# Patient Record
Sex: Male | Born: 1950 | Race: White | Hispanic: No | Marital: Single | Smoking: Never smoker
Health system: Southern US, Community
[De-identification: ages and names within clinical notes are randomized; demographics above are authoritative.]

## PROBLEM LIST (undated history)

## (undated) DIAGNOSIS — K5900 Constipation, unspecified: Secondary | ICD-10-CM

## (undated) DIAGNOSIS — I499 Cardiac arrhythmia, unspecified: Secondary | ICD-10-CM

## (undated) DIAGNOSIS — H9190 Unspecified hearing loss, unspecified ear: Secondary | ICD-10-CM

## (undated) DIAGNOSIS — I890 Lymphedema, not elsewhere classified: Secondary | ICD-10-CM

## (undated) DIAGNOSIS — L989 Disorder of the skin and subcutaneous tissue, unspecified: Secondary | ICD-10-CM

## (undated) DIAGNOSIS — I071 Rheumatic tricuspid insufficiency: Secondary | ICD-10-CM

## (undated) DIAGNOSIS — F32A Depression, unspecified: Secondary | ICD-10-CM

## (undated) DIAGNOSIS — F329 Major depressive disorder, single episode, unspecified: Secondary | ICD-10-CM

## (undated) DIAGNOSIS — R0602 Shortness of breath: Secondary | ICD-10-CM

## (undated) DIAGNOSIS — I10 Essential (primary) hypertension: Secondary | ICD-10-CM

## (undated) DIAGNOSIS — I340 Nonrheumatic mitral (valve) insufficiency: Secondary | ICD-10-CM

## (undated) DIAGNOSIS — M1990 Unspecified osteoarthritis, unspecified site: Secondary | ICD-10-CM

## (undated) DIAGNOSIS — Z9289 Personal history of other medical treatment: Secondary | ICD-10-CM

## (undated) HISTORY — PX: ANKLE SURGERY: SHX546

## (undated) HISTORY — PX: BACK SURGERY: SHX140

---

## 2004-04-06 ENCOUNTER — Other Ambulatory Visit: Payer: Self-pay

## 2006-01-04 ENCOUNTER — Ambulatory Visit: Payer: Self-pay | Admitting: Family Medicine

## 2006-06-22 ENCOUNTER — Inpatient Hospital Stay: Payer: Self-pay | Admitting: Surgery

## 2007-12-14 ENCOUNTER — Ambulatory Visit: Payer: Self-pay | Admitting: Cardiology

## 2008-11-08 DIAGNOSIS — Z9289 Personal history of other medical treatment: Secondary | ICD-10-CM

## 2008-11-08 HISTORY — DX: Personal history of other medical treatment: Z92.89

## 2008-11-08 HISTORY — PX: CHOLECYSTECTOMY: SHX55

## 2008-12-09 ENCOUNTER — Inpatient Hospital Stay: Payer: Self-pay | Admitting: Internal Medicine

## 2009-01-20 ENCOUNTER — Inpatient Hospital Stay: Payer: Self-pay | Admitting: Cardiology

## 2009-02-10 ENCOUNTER — Emergency Department: Payer: Self-pay

## 2009-05-26 ENCOUNTER — Emergency Department: Payer: Self-pay | Admitting: Internal Medicine

## 2009-06-08 ENCOUNTER — Ambulatory Visit: Payer: Self-pay | Admitting: Internal Medicine

## 2009-06-09 ENCOUNTER — Emergency Department: Payer: Self-pay | Admitting: Emergency Medicine

## 2009-06-12 ENCOUNTER — Emergency Department: Payer: Self-pay | Admitting: Unknown Physician Specialty

## 2009-06-16 ENCOUNTER — Inpatient Hospital Stay: Payer: Self-pay | Admitting: Student

## 2009-07-09 ENCOUNTER — Ambulatory Visit: Payer: Self-pay | Admitting: Internal Medicine

## 2010-02-27 ENCOUNTER — Inpatient Hospital Stay: Payer: Self-pay | Admitting: Internal Medicine

## 2010-03-11 ENCOUNTER — Ambulatory Visit: Payer: Self-pay | Admitting: Internal Medicine

## 2010-06-22 ENCOUNTER — Ambulatory Visit: Payer: Self-pay

## 2010-06-23 ENCOUNTER — Inpatient Hospital Stay: Payer: Self-pay | Admitting: Internal Medicine

## 2010-07-02 ENCOUNTER — Ambulatory Visit: Payer: Self-pay

## 2010-07-14 ENCOUNTER — Ambulatory Visit: Payer: Self-pay

## 2010-07-20 ENCOUNTER — Ambulatory Visit: Payer: Self-pay

## 2010-07-22 ENCOUNTER — Ambulatory Visit: Payer: Self-pay

## 2010-07-23 ENCOUNTER — Ambulatory Visit: Payer: Self-pay

## 2010-07-24 ENCOUNTER — Ambulatory Visit: Payer: Self-pay

## 2010-07-27 ENCOUNTER — Ambulatory Visit: Payer: Self-pay | Admitting: Internal Medicine

## 2010-07-29 ENCOUNTER — Ambulatory Visit: Payer: Self-pay

## 2010-08-03 ENCOUNTER — Ambulatory Visit: Payer: Self-pay

## 2010-08-06 ENCOUNTER — Ambulatory Visit: Payer: Self-pay

## 2010-08-10 ENCOUNTER — Ambulatory Visit: Payer: Self-pay

## 2010-08-13 ENCOUNTER — Ambulatory Visit: Payer: Self-pay

## 2010-08-14 ENCOUNTER — Ambulatory Visit: Payer: Self-pay

## 2010-08-17 ENCOUNTER — Ambulatory Visit: Payer: Self-pay

## 2010-08-20 ENCOUNTER — Ambulatory Visit: Payer: Self-pay

## 2010-08-24 ENCOUNTER — Ambulatory Visit: Payer: Self-pay

## 2010-08-27 ENCOUNTER — Ambulatory Visit: Payer: Self-pay

## 2010-08-31 ENCOUNTER — Ambulatory Visit: Payer: Self-pay

## 2010-09-02 ENCOUNTER — Encounter: Payer: Self-pay | Admitting: Internal Medicine

## 2010-09-08 ENCOUNTER — Encounter: Payer: Self-pay | Admitting: Internal Medicine

## 2010-09-16 ENCOUNTER — Ambulatory Visit: Payer: Self-pay | Admitting: Internal Medicine

## 2010-10-08 ENCOUNTER — Encounter: Payer: Self-pay | Admitting: Internal Medicine

## 2010-11-08 ENCOUNTER — Encounter: Payer: Self-pay | Admitting: Internal Medicine

## 2010-12-09 ENCOUNTER — Encounter: Payer: Self-pay | Admitting: Internal Medicine

## 2011-01-07 ENCOUNTER — Encounter: Payer: Self-pay | Admitting: Internal Medicine

## 2011-02-01 ENCOUNTER — Encounter: Payer: Self-pay | Admitting: Cardiothoracic Surgery

## 2011-02-07 ENCOUNTER — Encounter: Payer: Self-pay | Admitting: Internal Medicine

## 2011-02-07 ENCOUNTER — Encounter: Payer: Self-pay | Admitting: Cardiothoracic Surgery

## 2011-03-09 ENCOUNTER — Encounter: Payer: Self-pay | Admitting: Cardiothoracic Surgery

## 2011-04-09 ENCOUNTER — Encounter: Payer: Self-pay | Admitting: Cardiothoracic Surgery

## 2011-05-09 ENCOUNTER — Encounter: Payer: Self-pay | Admitting: Cardiothoracic Surgery

## 2011-07-30 ENCOUNTER — Encounter: Payer: Self-pay | Admitting: Cardiothoracic Surgery

## 2011-08-09 ENCOUNTER — Encounter: Payer: Self-pay | Admitting: Cardiothoracic Surgery

## 2012-01-22 ENCOUNTER — Emergency Department: Payer: Self-pay | Admitting: Emergency Medicine

## 2012-01-22 LAB — COMPREHENSIVE METABOLIC PANEL
Albumin: 3.7 g/dL (ref 3.4–5.0)
Anion Gap: 12 (ref 7–16)
BUN: 17 mg/dL (ref 7–18)
Bilirubin,Total: 1 mg/dL (ref 0.2–1.0)
Chloride: 105 mmol/L (ref 98–107)
Co2: 25 mmol/L (ref 21–32)
EGFR (African American): >60
EGFR (Non-African Amer.): >60
Osmolality: 285 (ref 275–301)
Potassium: 3.4 mmol/L — ABNORMAL LOW (ref 3.5–5.1)
SGPT (ALT): 31 U/L
Sodium: 142 mmol/L (ref 136–145)
Total Protein: 7.8 g/dL (ref 6.4–8.2)

## 2012-01-22 LAB — CBC
HGB: 14.3 g/dL (ref 13.0–18.0)
MCH: 29.1 pg (ref 26.0–34.0)
MCHC: 33.4 g/dL (ref 32.0–36.0)
RBC: 4.92 10*6/uL (ref 4.40–5.90)
RDW: 13.9 % (ref 11.5–14.5)

## 2012-02-13 ENCOUNTER — Emergency Department: Payer: Self-pay | Admitting: Internal Medicine

## 2012-02-13 LAB — URINALYSIS, COMPLETE
Bilirubin,UR: NEGATIVE
Glucose,UR: NEGATIVE mg/dL (ref 0–75)
Ketone: NEGATIVE
Leukocyte Esterase: NEGATIVE
Nitrite: NEGATIVE
Ph: 7 (ref 4.5–8.0)
Protein: 30
RBC,UR: 1 /HPF (ref 0–5)
Squamous Epithelial: <1
WBC UR: 1 /HPF (ref 0–5)

## 2012-02-13 LAB — COMPREHENSIVE METABOLIC PANEL
Alkaline Phosphatase: 106 U/L (ref 50–136)
Anion Gap: 7 (ref 7–16)
BUN: 15 mg/dL (ref 7–18)
Chloride: 108 mmol/L — ABNORMAL HIGH (ref 98–107)
Co2: 27 mmol/L (ref 21–32)
EGFR (African American): >60
EGFR (Non-African Amer.): >60
Glucose: 81 mg/dL (ref 65–99)
Osmolality: 283 (ref 275–301)
Potassium: 4.2 mmol/L (ref 3.5–5.1)
SGPT (ALT): 33 U/L
Total Protein: 7.2 g/dL (ref 6.4–8.2)

## 2012-02-13 LAB — CBC
HCT: 34.5 % — ABNORMAL LOW (ref 40.0–52.0)
MCHC: 34.7 g/dL (ref 32.0–36.0)
Platelet: 64 10*3/uL — ABNORMAL LOW (ref 150–440)
RBC: 3.91 10*6/uL — ABNORMAL LOW (ref 4.40–5.90)
RDW: 15.1 % — ABNORMAL HIGH (ref 11.5–14.5)
WBC: 3.6 10*3/uL — ABNORMAL LOW (ref 3.8–10.6)

## 2012-02-13 LAB — PROTIME-INR: INR: 4.1

## 2012-03-23 ENCOUNTER — Ambulatory Visit: Payer: Self-pay | Admitting: Specialist

## 2012-03-29 ENCOUNTER — Ambulatory Visit: Payer: Self-pay | Admitting: Specialist

## 2012-06-01 ENCOUNTER — Other Ambulatory Visit (HOSPITAL_COMMUNITY): Payer: Self-pay | Admitting: Neurological Surgery

## 2012-06-01 DIAGNOSIS — M5412 Radiculopathy, cervical region: Secondary | ICD-10-CM

## 2012-06-01 DIAGNOSIS — M542 Cervicalgia: Secondary | ICD-10-CM

## 2012-06-02 ENCOUNTER — Encounter (HOSPITAL_COMMUNITY): Payer: Self-pay

## 2012-06-02 ENCOUNTER — Other Ambulatory Visit (HOSPITAL_COMMUNITY): Payer: Self-pay | Admitting: *Deleted

## 2012-06-02 ENCOUNTER — Ambulatory Visit (HOSPITAL_COMMUNITY)
Admission: RE | Admit: 2012-06-02 | Discharge: 2012-06-02 | Disposition: A | Discharge status: Home / Self Care | Payer: PRIVATE HEALTH INSURANCE | Source: Ambulatory Visit | Attending: Neurological Surgery | Admitting: Neurological Surgery

## 2012-06-02 ENCOUNTER — Encounter (HOSPITAL_COMMUNITY)
Admission: RE | Admit: 2012-06-02 | Discharge: 2012-06-02 | Disposition: A | Discharge status: Home / Self Care | Payer: PRIVATE HEALTH INSURANCE | Source: Ambulatory Visit | Attending: Neurological Surgery | Admitting: Neurological Surgery

## 2012-06-02 DIAGNOSIS — Z01818 Encounter for other preprocedural examination: Secondary | ICD-10-CM | POA: Insufficient documentation

## 2012-06-02 DIAGNOSIS — Z01812 Encounter for preprocedural laboratory examination: Secondary | ICD-10-CM | POA: Insufficient documentation

## 2012-06-02 DIAGNOSIS — I4891 Unspecified atrial fibrillation: Secondary | ICD-10-CM | POA: Insufficient documentation

## 2012-06-02 DIAGNOSIS — I10 Essential (primary) hypertension: Secondary | ICD-10-CM | POA: Insufficient documentation

## 2012-06-02 HISTORY — DX: Essential (primary) hypertension: I10

## 2012-06-02 HISTORY — DX: Shortness of breath: R06.02

## 2012-06-02 HISTORY — DX: Rheumatic tricuspid insufficiency: I07.1

## 2012-06-02 HISTORY — DX: Unspecified hearing loss, unspecified ear: H91.90

## 2012-06-02 HISTORY — DX: Personal history of other medical treatment: Z92.89

## 2012-06-02 HISTORY — DX: Cardiac arrhythmia, unspecified: I49.9

## 2012-06-02 HISTORY — DX: Lymphedema, not elsewhere classified: I89.0

## 2012-06-02 HISTORY — DX: Disorder of the skin and subcutaneous tissue, unspecified: L98.9

## 2012-06-02 HISTORY — DX: Depression, unspecified: F32.A

## 2012-06-02 HISTORY — DX: Major depressive disorder, single episode, unspecified: F32.9

## 2012-06-02 HISTORY — DX: Nonrheumatic mitral (valve) insufficiency: I34.0

## 2012-06-02 HISTORY — DX: Unspecified osteoarthritis, unspecified site: M19.90

## 2012-06-02 HISTORY — DX: Constipation, unspecified: K59.00

## 2012-06-02 LAB — PROTIME-INR
INR: 1.36 (ref 0.00–1.49)
Prothrombin Time: 17 seconds — ABNORMAL HIGH (ref 11.6–15.2)

## 2012-06-02 LAB — BASIC METABOLIC PANEL
CO2: 22 mEq/L (ref 19–32)
Calcium: 9 mg/dL (ref 8.4–10.5)
GFR calc Af Amer: >90 mL/min (ref >90)
GFR calc non Af Amer: >90 mL/min (ref >90)
Sodium: 138 mEq/L (ref 135–145)

## 2012-06-02 LAB — CBC
MCV: 82.6 fL (ref 78.0–100.0)
Platelets: 69 10*3/uL — ABNORMAL LOW (ref 150–400)
RBC: 5.18 MIL/uL (ref 4.22–5.81)
WBC: 4.3 10*3/uL (ref 4.0–10.5)

## 2012-06-02 LAB — HEPATIC FUNCTION PANEL
Indirect Bilirubin: 0.9 mg/dL (ref 0.3–0.9)
Total Protein: 7.6 g/dL (ref 6.0–8.3)

## 2012-06-02 LAB — APTT: aPTT: 41 seconds — ABNORMAL HIGH (ref 24–37)

## 2012-06-02 NOTE — Progress Notes (Signed)
I faxed request to Dr.Parashos and Dr  Terance Hart requesting office notes, labs, X-Rays, EGs, Stress Test and 2 DEchos.

## 2012-06-02 NOTE — Progress Notes (Signed)
I called Dr Yetta Barre' office and asked for orders to be entered.

## 2012-06-02 NOTE — Progress Notes (Signed)
I spoke with Aram Beecham at Dr Yetta Barre' office, she said orders were faxed.  Aram Beecham also said that patient does not need to stop Coumadin.

## 2012-06-02 NOTE — Progress Notes (Signed)
I received notes information from Dr Celene Kras' office, no labs sent, I called the office it is closed for the day.

## 2012-06-02 NOTE — Progress Notes (Signed)
I call Dr Gar Gibbon and asked for recent labs.  I was informed that Dr Darrold Junker is manging pt's Coumadin.  Labs will be faxed.

## 2012-06-02 NOTE — Pre-Procedure Instructions (Signed)
20 Nathaniel Simpson  06/02/2012   Your procedure is scheduled on:  Tuesday, July 30th  Report to [facility] at 9:00 AM.  Call this number if you have problems the morning of surgery: [phone]   Remember:   Do not eat food Or anything to drink:After Midnight.      Take these medicines the morning of surgery with A SIP OF WATER: Allerga, Diltiazem (Dilacor)   Do not wear jewelry, make-up or nail polish.  Do not wear lotions, powders, or perfumes. You may wear deodorant.  Do not shave 48 hours prior to surgery. Men may shave face and neck.  Do not bring valuables to the hospital.  Contacts, dentures or bridgework may not be worn into surgery.  Leave suitcase in the car. After surgery it may be brought to your room.  For patients admitted to the hospital, checkout time is 11:00 AM the day of discharge.   Patients discharged the day of surgery will not be allowed to drive home.  Name and phone number of your driver  Special Instructions: CHG Shower Use Special Wash: 1/2 bottle night before surgery and 1/2 bottle morning of surgery.   Please read over the following fact sheets that you were given: Pain Booklet and Surgical Site Infection Prevention

## 2012-06-05 ENCOUNTER — Encounter (HOSPITAL_COMMUNITY): Payer: Self-pay | Admitting: Vascular Surgery

## 2012-06-05 NOTE — Consult Note (Signed)
Anesthesia Chart Review:  Patient is a 61 year old male scheduled for MRI under anesthesia on 06/06/12.  MRI is apparently ordered by Dr. Yetta Barre.  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  His PCP is Dr. Terance Hart.  His last OV note from April 2013 listed DM2 as a diagnosis as well, although he is not requiring medication and his A1C then was only 4.5.  His weight is 355 lb, height 6\' 2" , BMI 45.6.  His Cardiologist is Dr. Darrold Junker at [facility]).  He was last seen on 05/24/12.  He recommended a repeat echo to follow-up MR/TR as he reported some worsening of DOE.   His echo is not scheduled until 06/15/12.  EKG ([facility]) on 01/22/12 showed afib, minimal voltage criteria for LVH.  I called [facility] and reviewed planned procedure with staff.  Sandy, Dr. Darrold Junker' nurse reviewed this with him, and he stated the echo did not have to be done prior to Mr. Bommarito having an MRI under [facility].  Currently, his last echo from 07/08/10 showed mild global LV dysfunction, EF 45-50%, mild LV enlargement, moderate RV enlargement, moderate biatrial enlargement, moderate MR/TR.  CXR on 06/02/12 showed: Upper normal heart size. No focal worrisome or acute  cardiopulmonary abnormality identified.  Labs noted.  PLT are 69.  AST is 38, ALT 21.  PT/INR 17/1.36.  PTT 41.  Glucose is 94.  His PLT were 76 in April 2013.   He is not stopping his Coumadin for his MRI, so I am not planning to repeat his PT/PTT on arrival.  There is no mention to the etiology of this thrombocytopenia in his Cardiologist's or PCP note.  The patient said he remembers being told his PLTs are low, but has never really been told an explanation.  I told him that if he requires surgery, he would likely need further evaluation.  I also called and spoke with Rodney Booze in MRI [phone]) re: MRI weight limits.  She said typically the cut off is 350 lb, but it can  depend of the patient's body habitus.  If the ordering physician did not want to reschedule for an open MRI then she would recommend bringing the patient early and having him go down to MRI on arrival, so it can be determined if he could fit before getting the Anesthesiologist involved.  I spoke with Erie Noe at Dr. Yetta Barre' office re: MRI recommendations, thrombocytopenia and Cardiology history in the invent that he may ultimately require surgery and would probably need at least medical clearance (Hematology evaluation) and possibly Cardiology clearance depending on echo results.  She reviewed with Dr. Yetta Barre.  Apparently, open MRI has been attempted in the past, but he couldn't lay down due to pain.  The MRI will be on his c-spine.    I have communicated with his Cardiologist, the patient, and Anesthesiologist Dr. Jacklynn Bue.  He will need to go down to MRI on arrival to see if his body habitus will allow for closed MRI.  If not, then Anesthesia will need to be notified.  Currently, Mr. Micciche is scheduled to arrive 2 hours before his MRI.  Shonna Chock, PA-C

## 2012-06-06 ENCOUNTER — Encounter (HOSPITAL_COMMUNITY): Payer: Self-pay | Admitting: Certified Registered Nurse Anesthetist

## 2012-06-06 ENCOUNTER — Ambulatory Visit (HOSPITAL_COMMUNITY)
Admission: RE | Admit: 2012-06-06 | Discharge: 2012-06-06 | Disposition: A | Discharge status: Home / Self Care | Payer: PRIVATE HEALTH INSURANCE | Source: Ambulatory Visit | Attending: Neurological Surgery | Admitting: Neurological Surgery

## 2012-06-06 ENCOUNTER — Encounter (HOSPITAL_COMMUNITY): Payer: Self-pay | Admitting: Vascular Surgery

## 2012-06-06 ENCOUNTER — Other Ambulatory Visit (HOSPITAL_COMMUNITY): Payer: Self-pay

## 2012-06-06 ENCOUNTER — Encounter (HOSPITAL_COMMUNITY): Payer: Self-pay | Admitting: *Deleted

## 2012-06-06 ENCOUNTER — Encounter (HOSPITAL_COMMUNITY)
Admission: RE | Disposition: A | Discharge status: Home / Self Care | Payer: Self-pay | Source: Ambulatory Visit | Attending: Neurological Surgery

## 2012-06-06 ENCOUNTER — Ambulatory Visit (HOSPITAL_COMMUNITY): Payer: PRIVATE HEALTH INSURANCE | Admitting: Vascular Surgery

## 2012-06-06 DIAGNOSIS — M5412 Radiculopathy, cervical region: Secondary | ICD-10-CM

## 2012-06-06 DIAGNOSIS — M542 Cervicalgia: Secondary | ICD-10-CM

## 2012-06-06 DIAGNOSIS — M4802 Spinal stenosis, cervical region: Secondary | ICD-10-CM | POA: Insufficient documentation

## 2012-06-06 LAB — GLUCOSE, CAPILLARY: Glucose-Capillary: 91 mg/dL (ref 70–99)

## 2012-06-06 SURGERY — RADIOLOGY WITH ANESTHESIA
Anesthesia: General

## 2012-06-06 MED ORDER — PROPOFOL 10 MG/ML IV EMUL
INTRAVENOUS | Status: DC | PRN
  Administered 2012-06-06: 200 mg via INTRAVENOUS

## 2012-06-06 MED ORDER — LIDOCAINE HCL (CARDIAC) 20 MG/ML IV SOLN
INTRAVENOUS | Status: DC | PRN
  Administered 2012-06-06: 70 mg via INTRAVENOUS

## 2012-06-06 MED ORDER — SUCCINYLCHOLINE CHLORIDE 20 MG/ML IJ SOLN
INTRAMUSCULAR | Status: DC | PRN
  Administered 2012-06-06: 110 mg via INTRAVENOUS

## 2012-06-06 MED ORDER — HYDROMORPHONE HCL PF 1 MG/ML IJ SOLN: 0.2500 mg | INTRAMUSCULAR | Status: DC | PRN

## 2012-06-06 MED ORDER — FENTANYL CITRATE 0.05 MG/ML IJ SOLN
INTRAMUSCULAR | Status: DC | PRN
  Administered 2012-06-06: 100 ug via INTRAVENOUS

## 2012-06-06 MED ORDER — LACTATED RINGERS IV SOLN
INTRAVENOUS | Status: DC
  Administered 2012-06-06: 11:00:00 via INTRAVENOUS

## 2012-06-06 MED ORDER — LACTATED RINGERS IV SOLN
INTRAVENOUS | Status: DC | PRN
  Administered 2012-06-06: 12:00:00 via INTRAVENOUS

## 2012-06-06 MED ORDER — ONDANSETRON HCL 4 MG/2ML IJ SOLN: 4.0000 mg | Freq: Once | INTRAMUSCULAR | Status: DC | PRN

## 2012-06-06 NOTE — Anesthesia Postprocedure Evaluation (Signed)
  Anesthesia Post-op Note  Patient: Nathaniel Simpson  Procedure(s) Performed: Procedure(s) (LRB): RADIOLOGY WITH ANESTHESIA (N/A)  Patient Location: PACU  Anesthesia Type: General  Level of Consciousness: awake, alert , oriented and patient cooperative  Airway and Oxygen Therapy: Patient Spontanous Breathing and Patient connected to nasal cannula oxygen  Post-op Pain: none  Post-op Assessment: Post-op Vital signs reviewed, Patient's Cardiovascular Status Stable, Respiratory Function Stable, Patent Airway and No signs of Nausea or vomiting  Post-op Vital Signs: stable  Complications: No apparent anesthesia complications

## 2012-06-06 NOTE — Anesthesia Preprocedure Evaluation (Addendum)
Anesthesia Evaluation  Patient identified by MRN, date of birth, ID band Patient awake    Reviewed: Allergy & Precautions, H&P , NPO status , Patient's Chart, lab work & pertinent test results  History of Anesthesia Complications (+) DIFFICULT AIRWAY  Airway Mallampati: I TM Distance: >3 FB Neck ROM: full    Dental  (+) Poor Dentition, Edentulous Upper and Edentulous Lower   Pulmonary shortness of breath,          Cardiovascular hypertension, + dysrhythmias Atrial Fibrillation Rhythm:regular Rate:Normal     Neuro/Psych PSYCHIATRIC DISORDERS Depression    GI/Hepatic   Endo/Other  Morbid obesity  Renal/GU      Musculoskeletal   Abdominal   Peds  Hematology   Anesthesia Other Findings   Reproductive/Obstetrics                        Anesthesia Physical Anesthesia Plan  ASA: III  Anesthesia Plan: General   Post-op Pain Management:    Induction: Intravenous  Airway Management Planned: Oral ETT  Additional Equipment:   Intra-op Plan:   Post-operative Plan: Extubation in OR  Informed Consent: I have reviewed the patients History and Physical, chart, labs and discussed the procedure including the risks, benefits and alternatives for the proposed anesthesia with the patient or authorized representative who has indicated his/her understanding and acceptance.     Plan Discussed with: CRNA, Anesthesiologist and Surgeon  Anesthesia Plan Comments:        Anesthesia Quick Evaluation

## 2012-06-06 NOTE — Transfer of Care (Signed)
Immediate Anesthesia Transfer of Care Note  Patient: Nathaniel Simpson  Procedure(s) Performed: Procedure(s) (LRB): RADIOLOGY WITH ANESTHESIA (N/A)  Patient Location: PACU  Anesthesia Type: General  Level of Consciousness: awake, alert , oriented and patient cooperative  Airway & Oxygen Therapy: Patient Spontanous Breathing and Patient connected to face mask oxygen  Post-op Assessment: Report given to PACU RN, Post -op Vital signs reviewed and stable and Patient moving all extremities  Post vital signs: Reviewed and stable  Complications: No apparent anesthesia complications

## 2012-06-06 NOTE — Progress Notes (Signed)
Pt. Went down to radiology to make sure he fit in the MRI machine. When the tech brought pt. Back they wanted another weight. They stated pt. Would be able to have the procedure done.

## 2012-06-06 NOTE — Preoperative (Signed)
Beta Blockers   Reason not to administer Beta Blockers:Not Applicable 

## 2012-07-17 LAB — CBC WITH DIFFERENTIAL/PLATELET
Basophil #: 0 10*3/uL (ref 0.0–0.1)
Lymphocyte #: 0.4 10*3/uL — ABNORMAL LOW (ref 1.0–3.6)
MCH: 29.7 pg (ref 26.0–34.0)
MCHC: 34.9 g/dL (ref 32.0–36.0)
MCV: 85 fL (ref 80–100)
Monocyte #: 0.4 x10 3/mm (ref 0.2–1.0)
Neutrophil %: 91.4 %
Platelet: 92 10*3/uL — ABNORMAL LOW (ref 150–440)
RBC: 5.02 10*6/uL (ref 4.40–5.90)
RDW: 15.9 % — ABNORMAL HIGH (ref 11.5–14.5)

## 2012-07-17 LAB — BASIC METABOLIC PANEL
Calcium, Total: 8.4 mg/dL — ABNORMAL LOW (ref 8.5–10.1)
Chloride: 108 mmol/L — ABNORMAL HIGH (ref 98–107)
EGFR (Non-African Amer.): >60
Osmolality: 284 (ref 275–301)
Potassium: 4.2 mmol/L (ref 3.5–5.1)
Sodium: 140 mmol/L (ref 136–145)

## 2012-07-18 ENCOUNTER — Inpatient Hospital Stay: Payer: Self-pay | Admitting: Internal Medicine

## 2012-07-19 LAB — CBC WITH DIFFERENTIAL/PLATELET
Basophil #: 0.1 10*3/uL (ref 0.0–0.1)
HCT: 37.6 % — ABNORMAL LOW (ref 40.0–52.0)
Lymphocyte %: 7.9 %
MCV: 86 fL (ref 80–100)
Monocyte %: 6 %
Platelet: 77 10*3/uL — ABNORMAL LOW (ref 150–440)
RBC: 4.4 10*6/uL (ref 4.40–5.90)
RDW: 16.1 % — ABNORMAL HIGH (ref 11.5–14.5)
WBC: 8.3 10*3/uL (ref 3.8–10.6)

## 2012-07-19 LAB — BASIC METABOLIC PANEL
Calcium, Total: 7.8 mg/dL — ABNORMAL LOW (ref 8.5–10.1)
Chloride: 107 mmol/L (ref 98–107)
EGFR (Non-African Amer.): >60
Glucose: 89 mg/dL (ref 65–99)
Osmolality: 280 (ref 275–301)
Potassium: 3.9 mmol/L (ref 3.5–5.1)
Sodium: 140 mmol/L (ref 136–145)

## 2012-07-20 LAB — CLOSTRIDIUM DIFFICILE BY PCR

## 2012-07-21 LAB — PROTIME-INR
INR: 1.5
Prothrombin Time: 18.7 secs — ABNORMAL HIGH (ref 11.5–14.7)

## 2012-07-22 LAB — PROTIME-INR: Prothrombin Time: 21.7 secs — ABNORMAL HIGH (ref 11.5–14.7)

## 2012-07-23 LAB — CREATININE, SERUM
Creatinine: 0.95 mg/dL
EGFR (African American): >60
EGFR (Non-African Amer.): >60

## 2012-07-23 LAB — CULTURE, BLOOD (SINGLE)

## 2012-07-23 LAB — PROTIME-INR: Prothrombin Time: 25.6 secs — ABNORMAL HIGH (ref 11.5–14.7)

## 2012-07-31 ENCOUNTER — Emergency Department: Payer: Self-pay | Admitting: *Deleted

## 2012-07-31 LAB — COMPREHENSIVE METABOLIC PANEL
Albumin: 3.3 g/dL — ABNORMAL LOW (ref 3.4–5.0)
Anion Gap: 10 (ref 7–16)
BUN: 15 mg/dL (ref 7–18)
Bilirubin,Total: 1.1 mg/dL — ABNORMAL HIGH (ref 0.2–1.0)
Creatinine: 1.22 mg/dL (ref 0.60–1.30)
EGFR (African American): >60
Glucose: 86 mg/dL (ref 65–99)
Osmolality: 276 (ref 275–301)
Potassium: 4.6 mmol/L (ref 3.5–5.1)
SGOT(AST): 43 U/L — ABNORMAL HIGH (ref 15–37)
SGPT (ALT): 30 U/L (ref 12–78)
Total Protein: 8.5 g/dL — ABNORMAL HIGH (ref 6.4–8.2)

## 2012-07-31 LAB — CBC WITH DIFFERENTIAL/PLATELET
Basophil %: 0.2 %
Eosinophil #: 0 10*3/uL (ref 0.0–0.7)
Eosinophil %: 0 %
HCT: 45.6 % (ref 40.0–52.0)
HGB: 15.6 g/dL (ref 13.0–18.0)
Lymphocyte #: 0.5 10*3/uL — ABNORMAL LOW (ref 1.0–3.6)
Lymphocyte %: 4.9 %
MCH: 30.1 pg (ref 26.0–34.0)
MCHC: 34.2 g/dL (ref 32.0–36.0)
Monocyte #: 0.4 x10 3/mm (ref 0.2–1.0)
Neutrophil #: 8.9 10*3/uL — ABNORMAL HIGH (ref 1.4–6.5)
Neutrophil %: 91 %
RDW: 16.2 % — ABNORMAL HIGH (ref 11.5–14.5)

## 2012-07-31 LAB — LIPASE, BLOOD: Lipase: 271 U/L (ref 73–393)

## 2012-07-31 LAB — TROPONIN I: Troponin-I: <0.02 ng/mL

## 2012-07-31 LAB — CK TOTAL AND CKMB (NOT AT ARMC)
CK, Total: 55 U/L (ref 35–232)
CK-MB: <0.5 ng/mL — ABNORMAL LOW (ref 0.5–3.6)

## 2012-08-01 LAB — URINALYSIS, COMPLETE
Bilirubin,UR: NEGATIVE
Blood: NEGATIVE
Glucose,UR: NEGATIVE mg/dL (ref 0–75)
Ketone: NEGATIVE
Leukocyte Esterase: NEGATIVE
Nitrite: NEGATIVE
Protein: 100
RBC,UR: 2 /HPF (ref 0–5)
WBC UR: 3 /HPF (ref 0–5)

## 2012-08-01 LAB — PROTIME-INR: INR: 3.3

## 2012-08-01 LAB — APTT: Activated PTT: 60.9 secs — ABNORMAL HIGH (ref 23.6–35.9)

## 2012-08-01 LAB — PRO B NATRIURETIC PEPTIDE: B-Type Natriuretic Peptide: 2198 pg/mL — ABNORMAL HIGH (ref 0–125)

## 2012-08-06 LAB — CULTURE, BLOOD (SINGLE)

## 2012-08-07 ENCOUNTER — Encounter: Payer: Self-pay | Admitting: Cardiothoracic Surgery

## 2012-08-07 ENCOUNTER — Encounter: Payer: Self-pay | Admitting: Nurse Practitioner

## 2012-08-08 ENCOUNTER — Encounter: Payer: Self-pay | Admitting: Nurse Practitioner

## 2012-08-08 ENCOUNTER — Encounter: Payer: Self-pay | Admitting: Cardiothoracic Surgery

## 2012-08-24 ENCOUNTER — Ambulatory Visit: Payer: Self-pay | Admitting: Nurse Practitioner

## 2012-09-08 ENCOUNTER — Encounter: Payer: Self-pay | Admitting: Cardiothoracic Surgery

## 2012-09-08 ENCOUNTER — Encounter: Payer: Self-pay | Admitting: Nurse Practitioner

## 2012-09-26 ENCOUNTER — Other Ambulatory Visit: Payer: Self-pay | Admitting: Gastroenterology

## 2012-09-27 LAB — WOUND CULTURE

## 2012-10-08 ENCOUNTER — Encounter: Payer: Self-pay | Admitting: Nurse Practitioner

## 2012-10-08 ENCOUNTER — Encounter: Payer: Self-pay | Admitting: Cardiothoracic Surgery

## 2012-10-23 ENCOUNTER — Ambulatory Visit: Payer: Self-pay | Admitting: Family Medicine

## 2012-10-26 ENCOUNTER — Ambulatory Visit: Payer: Self-pay | Admitting: Gastroenterology

## 2012-11-08 ENCOUNTER — Encounter: Payer: Self-pay | Admitting: Nurse Practitioner

## 2012-11-08 ENCOUNTER — Encounter: Payer: Self-pay | Admitting: Cardiothoracic Surgery

## 2012-11-28 ENCOUNTER — Other Ambulatory Visit (HOSPITAL_COMMUNITY): Payer: Self-pay | Admitting: Gastroenterology

## 2012-11-28 DIAGNOSIS — K7460 Unspecified cirrhosis of liver: Secondary | ICD-10-CM

## 2012-11-30 ENCOUNTER — Other Ambulatory Visit (HOSPITAL_COMMUNITY): Payer: Self-pay | Admitting: Gastroenterology

## 2012-11-30 DIAGNOSIS — K7460 Unspecified cirrhosis of liver: Secondary | ICD-10-CM

## 2012-12-05 ENCOUNTER — Other Ambulatory Visit: Payer: Self-pay | Admitting: Radiology

## 2012-12-08 ENCOUNTER — Ambulatory Visit (HOSPITAL_COMMUNITY): Admission: RE | Admit: 2012-12-08 | Payer: PRIVATE HEALTH INSURANCE | Source: Ambulatory Visit

## 2013-07-20 ENCOUNTER — Other Ambulatory Visit: Payer: Self-pay | Admitting: Family

## 2013-07-20 LAB — APTT: Activated PTT: 36 secs — ABNORMAL HIGH (ref 23.6–35.9)

## 2013-09-27 ENCOUNTER — Ambulatory Visit: Payer: Self-pay | Admitting: Cardiology

## 2014-02-02 LAB — COMPREHENSIVE METABOLIC PANEL
ALK PHOS: 214 U/L — AB
ANION GAP: 6 — AB (ref 7–16)
Albumin: 3.4 g/dL (ref 3.4–5.0)
BILIRUBIN TOTAL: 2.5 mg/dL — AB (ref 0.2–1.0)
BUN: 17 mg/dL (ref 7–18)
CO2: 27 mmol/L (ref 21–32)
Calcium, Total: 8.6 mg/dL (ref 8.5–10.1)
Chloride: 106 mmol/L (ref 98–107)
Creatinine: 1.23 mg/dL (ref 0.60–1.30)
EGFR (African American): >60
GLUCOSE: 94 mg/dL (ref 65–99)
Osmolality: 279 (ref 275–301)
Potassium: 3.5 mmol/L (ref 3.5–5.1)
SGOT(AST): 49 U/L — ABNORMAL HIGH (ref 15–37)
SGPT (ALT): 22 U/L (ref 12–78)
SODIUM: 139 mmol/L (ref 136–145)
TOTAL PROTEIN: 8 g/dL (ref 6.4–8.2)

## 2014-02-02 LAB — CBC
HCT: 36.1 % — ABNORMAL LOW (ref 40.0–52.0)
HGB: 12.5 g/dL — ABNORMAL LOW (ref 13.0–18.0)
MCH: 31.3 pg (ref 26.0–34.0)
MCHC: 34.5 g/dL (ref 32.0–36.0)
MCV: 91 fL (ref 80–100)
PLATELETS: 79 10*3/uL — AB (ref 150–440)
RBC: 3.99 10*6/uL — AB (ref 4.40–5.90)
RDW: 16.5 % — ABNORMAL HIGH (ref 11.5–14.5)
WBC: 5.8 10*3/uL (ref 3.8–10.6)

## 2014-02-02 LAB — PROTIME-INR
INR: 1.3
Prothrombin Time: 15.8 secs — ABNORMAL HIGH (ref 11.5–14.7)

## 2014-02-02 LAB — LIPASE, BLOOD: Lipase: 473 U/L — ABNORMAL HIGH (ref 73–393)

## 2014-02-03 ENCOUNTER — Inpatient Hospital Stay: Payer: Self-pay | Admitting: Internal Medicine

## 2014-02-03 LAB — HEMOGLOBIN
HGB: 11.5 g/dL — AB (ref 13.0–18.0)
HGB: 12 g/dL — AB (ref 13.0–18.0)

## 2014-02-04 LAB — CBC WITH DIFFERENTIAL/PLATELET
BASOS PCT: 0.9 %
Basophil #: 0 10*3/uL (ref 0.0–0.1)
Eosinophil #: 0.1 10*3/uL (ref 0.0–0.7)
Eosinophil %: 2.3 %
HCT: 35.3 % — ABNORMAL LOW (ref 40.0–52.0)
HGB: 12.3 g/dL — AB (ref 13.0–18.0)
LYMPHS PCT: 18.6 %
Lymphocyte #: 0.7 10*3/uL — ABNORMAL LOW (ref 1.0–3.6)
MCH: 32 pg (ref 26.0–34.0)
MCHC: 35 g/dL (ref 32.0–36.0)
MCV: 91 fL (ref 80–100)
MONO ABS: 0.3 x10 3/mm (ref 0.2–1.0)
Monocyte %: 7.4 %
NEUTROS ABS: 2.7 10*3/uL (ref 1.4–6.5)
Neutrophil %: 70.8 %
PLATELETS: 65 10*3/uL — AB (ref 150–440)
RBC: 3.86 10*6/uL — ABNORMAL LOW (ref 4.40–5.90)
RDW: 16.7 % — AB (ref 11.5–14.5)
WBC: 3.8 10*3/uL (ref 3.8–10.6)

## 2014-02-04 LAB — BASIC METABOLIC PANEL
Anion Gap: 7 (ref 7–16)
BUN: 13 mg/dL (ref 7–18)
CALCIUM: 8 mg/dL — AB (ref 8.5–10.1)
CHLORIDE: 108 mmol/L — AB (ref 98–107)
CREATININE: 1.07 mg/dL (ref 0.60–1.30)
Co2: 24 mmol/L (ref 21–32)
EGFR (Non-African Amer.): >60
Glucose: 82 mg/dL (ref 65–99)
Osmolality: 277 (ref 275–301)
POTASSIUM: 3.8 mmol/L (ref 3.5–5.1)
SODIUM: 139 mmol/L (ref 136–145)

## 2014-03-08 ENCOUNTER — Ambulatory Visit: Payer: Self-pay | Admitting: Internal Medicine

## 2014-03-24 ENCOUNTER — Emergency Department: Payer: Self-pay | Admitting: Internal Medicine

## 2014-03-24 LAB — URINALYSIS, COMPLETE
BILIRUBIN, UR: NEGATIVE
Bacteria: NONE SEEN
GLUCOSE, UR: NEGATIVE mg/dL (ref 0–75)
Ketone: NEGATIVE
Leukocyte Esterase: NEGATIVE
NITRITE: NEGATIVE
Ph: 7 (ref 4.5–8.0)
RBC,UR: 2 /HPF (ref 0–5)
SPECIFIC GRAVITY: 1.019 (ref 1.003–1.030)
SQUAMOUS EPITHELIAL: NONE SEEN

## 2014-03-24 LAB — DRUG SCREEN, URINE

## 2014-03-24 LAB — COMPREHENSIVE METABOLIC PANEL
ANION GAP: 8 (ref 7–16)
AST: 63 U/L — AB (ref 15–37)
Albumin: 3.1 g/dL — ABNORMAL LOW (ref 3.4–5.0)
Alkaline Phosphatase: 194 U/L — ABNORMAL HIGH
BILIRUBIN TOTAL: 4.5 mg/dL — AB (ref 0.2–1.0)
BUN: 14 mg/dL (ref 7–18)
CALCIUM: 8.8 mg/dL (ref 8.5–10.1)
CHLORIDE: 105 mmol/L (ref 98–107)
CREATININE: 1.04 mg/dL (ref 0.60–1.30)
Co2: 23 mmol/L (ref 21–32)
EGFR (African American): >60
EGFR (Non-African Amer.): >60
Glucose: 109 mg/dL — ABNORMAL HIGH (ref 65–99)
Osmolality: 273 (ref 275–301)
POTASSIUM: 4.2 mmol/L (ref 3.5–5.1)
SGPT (ALT): 24 U/L (ref 12–78)
SODIUM: 136 mmol/L (ref 136–145)
Total Protein: 7.7 g/dL (ref 6.4–8.2)

## 2014-03-24 LAB — CBC
HCT: 41.1 % (ref 40.0–52.0)
HGB: 14.2 g/dL (ref 13.0–18.0)
MCH: 32.9 pg (ref 26.0–34.0)
MCHC: 34.5 g/dL (ref 32.0–36.0)
MCV: 95 fL (ref 80–100)
Platelet: 48 10*3/uL — ABNORMAL LOW (ref 150–440)
RBC: 4.31 10*6/uL — ABNORMAL LOW (ref 4.40–5.90)
RDW: 16.7 % — AB (ref 11.5–14.5)
WBC: 6.9 10*3/uL (ref 3.8–10.6)

## 2014-03-24 LAB — CK TOTAL AND CKMB (NOT AT ARMC)
CK, Total: 373 U/L — ABNORMAL HIGH
CK-MB: 6.8 ng/mL — ABNORMAL HIGH (ref 0.5–3.6)

## 2014-03-24 LAB — TROPONIN I: Troponin-I: 0.13 ng/mL — ABNORMAL HIGH

## 2014-03-24 LAB — PROTIME-INR
INR: 1.3
Prothrombin Time: 16 secs — ABNORMAL HIGH (ref 11.5–14.7)

## 2014-03-24 LAB — AMMONIA: AMMONIA, PLASMA: 19 umol/L (ref 11–32)

## 2014-03-24 LAB — ETHANOL: Ethanol %: <0.003 % (ref 0.000–0.080)

## 2014-04-04 ENCOUNTER — Inpatient Hospital Stay: Payer: Self-pay | Admitting: Internal Medicine

## 2014-04-04 LAB — URINALYSIS, COMPLETE
Bilirubin,UR: NEGATIVE
Glucose,UR: NEGATIVE mg/dL (ref 0–75)
Ketone: NEGATIVE
Nitrite: POSITIVE
PH: 5 (ref 4.5–8.0)
Protein: NEGATIVE
RBC,UR: 89 /HPF (ref 0–5)
SQUAMOUS EPITHELIAL: NONE SEEN
Specific Gravity: 1.021 (ref 1.003–1.030)
WBC UR: 623 /HPF (ref 0–5)

## 2014-04-04 LAB — COMPREHENSIVE METABOLIC PANEL
ALK PHOS: 176 U/L — AB
Albumin: 2.9 g/dL — ABNORMAL LOW (ref 3.4–5.0)
Anion Gap: 10 (ref 7–16)
BUN: 53 mg/dL — AB (ref 7–18)
Bilirubin,Total: 4.3 mg/dL — ABNORMAL HIGH (ref 0.2–1.0)
CO2: 23 mmol/L (ref 21–32)
CREATININE: 1.07 mg/dL (ref 0.60–1.30)
Calcium, Total: 8.6 mg/dL (ref 8.5–10.1)
Chloride: 117 mmol/L — ABNORMAL HIGH (ref 98–107)
EGFR (African American): >60
EGFR (Non-African Amer.): >60
GLUCOSE: 172 mg/dL — AB (ref 65–99)
OSMOLALITY: 316 (ref 275–301)
POTASSIUM: 4.5 mmol/L (ref 3.5–5.1)
SGOT(AST): 122 U/L — ABNORMAL HIGH (ref 15–37)
SGPT (ALT): 173 U/L — ABNORMAL HIGH (ref 12–78)
SODIUM: 150 mmol/L — AB (ref 136–145)
TOTAL PROTEIN: 6.6 g/dL (ref 6.4–8.2)

## 2014-04-04 LAB — HEMOGLOBIN: HGB: 16.1 g/dL (ref 13.0–18.0)

## 2014-04-04 LAB — CBC
HCT: 46.2 % (ref 40.0–52.0)
HGB: 15.5 g/dL (ref 13.0–18.0)
MCH: 32.3 pg (ref 26.0–34.0)
MCHC: 33.6 g/dL (ref 32.0–36.0)
MCV: 96 fL (ref 80–100)
PLATELETS: 99 10*3/uL — AB (ref 150–440)
RBC: 4.81 10*6/uL (ref 4.40–5.90)
RDW: 17.6 % — AB (ref 11.5–14.5)
WBC: 23.6 10*3/uL — ABNORMAL HIGH (ref 3.8–10.6)

## 2014-04-04 LAB — PROTIME-INR
INR: 1.6
Prothrombin Time: 19.1 secs — ABNORMAL HIGH (ref 11.5–14.7)

## 2014-04-04 LAB — AMMONIA: AMMONIA, PLASMA: 75 umol/L — AB (ref 11–32)

## 2014-04-05 LAB — CBC WITH DIFFERENTIAL/PLATELET
BASOS PCT: 0.3 %
Basophil #: 0.1 10*3/uL (ref 0.0–0.1)
EOS ABS: 0.1 10*3/uL (ref 0.0–0.7)
Eosinophil %: 0.3 %
HCT: 46.5 % (ref 40.0–52.0)
HGB: 15.5 g/dL (ref 13.0–18.0)
LYMPHS PCT: 4.5 %
Lymphocyte #: 0.9 10*3/uL — ABNORMAL LOW (ref 1.0–3.6)
MCH: 32.3 pg (ref 26.0–34.0)
MCHC: 33.4 g/dL (ref 32.0–36.0)
MCV: 97 fL (ref 80–100)
MONOS PCT: 6.9 %
Monocyte #: 1.4 x10 3/mm — ABNORMAL HIGH (ref 0.2–1.0)
NEUTROS ABS: 17.8 10*3/uL — AB (ref 1.4–6.5)
Neutrophil %: 88 %
PLATELETS: 74 10*3/uL — AB (ref 150–440)
RBC: 4.81 10*6/uL (ref 4.40–5.90)
RDW: 18.1 % — ABNORMAL HIGH (ref 11.5–14.5)
WBC: 20.3 10*3/uL — AB (ref 3.8–10.6)

## 2014-04-05 LAB — BASIC METABOLIC PANEL
Anion Gap: 4 — ABNORMAL LOW (ref 7–16)
BUN: 49 mg/dL — ABNORMAL HIGH (ref 7–18)
CALCIUM: 8.6 mg/dL (ref 8.5–10.1)
CREATININE: 1.16 mg/dL (ref 0.60–1.30)
Chloride: 119 mmol/L — ABNORMAL HIGH (ref 98–107)
Co2: 27 mmol/L (ref 21–32)
Glucose: 125 mg/dL — ABNORMAL HIGH (ref 65–99)
Osmolality: 312 (ref 275–301)
POTASSIUM: 4.5 mmol/L (ref 3.5–5.1)
Sodium: 150 mmol/L — ABNORMAL HIGH (ref 136–145)

## 2014-04-05 LAB — MAGNESIUM: Magnesium: 2.7 mg/dL — ABNORMAL HIGH

## 2014-04-06 LAB — URINE CULTURE

## 2014-04-09 LAB — CULTURE, BLOOD (SINGLE)

## 2014-05-08 DEATH — deceased

## 2015-02-25 NOTE — Consult Note (Signed)
Impression: 64yo WM w/ h/o chronic LE lymphedema admitted with RLE cellulitis and C. diff colitis.  His leg has been doing better over the past few days.  He is on extremely broad coverage, which likely puts significant selective pressure bowel flora.  He came into the hospital already having diarrhea.  He denies any prior recent antibiotic use. Will change him to doxyxyxline keflex orally.  He has a PCN allergy.  There has been no history of resistant GPC nor Pseudomonas.  This will still have some selective pressure on the bowel flora, but is less broad. Would treat for 10 days total with doxycycline keflex (through 9/20). Would continue metronidazole for 10 days after completing keflex (through 9/30). Maintain contact precautions.   Electronic Signatures: Crystalynn Mcinerney, Rosalyn GessMichael E (MD) (Signed on 13-Sep-13 14:55)  Authored   Last Updated: 13-Sep-13 15:03 by Blayden Conwell, Rosalyn GessMichael E (MD)

## 2015-02-25 NOTE — H&P (Signed)
PATIENT NAME:  Nathaniel Simpson, Nathaniel Simpson MR#:  [id number] DATE OF BIRTH:  01-15-1951  DATE OF ADMISSION:  07/18/2012  PRIMARY CARE PHYSICIAN: Dr. Dorothey Baseman.  PRIMARY CARDIOLOGIST: Dr. Darrold Junker  History obtained from patient, old records reviewed, imaging studies reviewed personally along with EKG. Case discussed with ER physician.   CHIEF COMPLAINT: Right lower extremity swelling and redness.   HISTORY OF PRESENTING ILLNESS: The patient is a 64 year old Caucasian male patient with history of chronic atrial fibrillation on Coumadin, bilateral lower extremity edema, and right lower extremity chronic ulcers presents to the Emergency Room complaining of worsening swelling in the right lower extremity along with foul-smelling discharge and redness. The patient does not complain of any pain. [facility]. Over the last two days there has been worsening redness. He has had subjective fevers, although afebrile in the Emergency Room. His white count is elevated at 13,000 with 92% neutrophils, and with history of MRSA the patient has received a dose of vancomycin and is being admitted to the hospitalist service for right lower extremity cellulitis. The patient has also had tachycardia into the 120s with atrial fibrillation.   He has not used any recent antibiotics. He saw his primary care physician two weeks back with no change in medications. He was admitted to the hospital in 2011  the last time for right lower extremity cellulitis.   REVIEW OF SYSTEMS: CONSTITUTIONAL: Complains of some fatigue and weakness. No weight loss or weight gain. EYES: No blurred vision, pain, or redness. ENT: No tinnitus, ear pain, or hearing loss. RESPIRATORY: No cough, wheeze, hemoptysis, or dyspnea. CARDIOVASCULAR: No chest pain, orthopnea, edema, or arrhythmia. GI: No nausea, vomiting, diarrhea, or abdominal pain. GENITOURINARY: No dysuria, hematuria, or frequency. ENDOCRINE: No polyuria, nocturia, or thyroid  problems. HEMATOLOGIC/LYMPHATIC: No anemia, easy bruising, or bleeding. INTEGUMENT: Has ulcers on the right lower extremity and some excoriation of the left lower extremity along with swelling and redness in the right lower extremity. MUSCULOSKELETAL: Has chronic arthritis. No gout. NEUROLOGIC: No numbness, weakness, or dysarthria. PSYCH: No anxiety or depression.   PAST MEDICAL HISTORY: 1. Atrial fibrillation, on Coumadin.  2. Hypertension.  3. Bilateral lower extremity lymphedema and chronic right lower extremity ulcers.  4. Esophageal stricture status post dilation 2005.   PAST SURGICAL HISTORY: Cholecystectomy.   ALLERGIES TO MEDICATIONS:  Penicillin, which causes hives.   HOME MEDICATIONS:  1. Cardizem CD 240 mg daily.  2. Coumadin 4.5 and 5 mg alternating doses.  3. Lasix 20 mg twice a day.  4. Metoprolol succinate 50 mg oral once a day.  5. Symbicort 80/4.5 inhaled twice a day.  6. Allegra 24 hour, 1 tablet oral daily.   SOCIAL HISTORY: The patient works in a [employer]. He does not smoke, drink alcohol, or use illicit drugs. Lives with his mom.   FAMILY HISTORY: Father had a valve replacement surgery on of his brothers had coronary artery bypass graft.   PHYSICAL EXAMINATION:  VITAL SIGNS: Temperature 98.6, pulse 122, irregular, respirations 20, blood pressure 152/83, saturating 99% on room air.   GENERAL: Morbidly obese Caucasian male patient lying in bed, comfortable, conversational, cooperative with exam, has decreased hearing.   PSYCHIATRIC: Alert, oriented times three. Mood and affect appropriate. Judgment intact.   HEENT: Atraumatic, normocephalic. Oral mucosa moist and pink. External ears and nose normal. No pallor. No icterus. Pupils bilaterally equal and reactive to light.   NECK: Supple. No thyromegaly. No palpable lymph nodes. Trachea midline. No carotid bruit  or JVD.   CARDIOVASCULAR: S1, S2, tachycardic, irregular, without any murmurs. Peripheral pulses  difficult to palpate in the lower extremities but 2+ in the upper extremities. Has significant edema in bilateral lower extremities greater on the right than the left.   RESPIRATORY: Normal work of breathing. Clear to auscultation on both sides.   GASTROINTESTINAL: Soft abdomen, nontender. Bowel sounds present. No hepatosplenomegaly palpable.   SKIN: Warm and dry. He has redness and erythema in the right lower extremity below the knee and extending along the lateral thigh. He has an open ulcer measuring about 7 x 4 cm on the anterior leg with serous drainage and about a 4 x 4 cm ulcer on the ventral portion of the leg with serous discharge. Both ulcers have irregular margins and granulation tissue. He has dry skin with some excoriation in the left lower extremity. No other ulcer or rash noticed.   NEUROLOGICAL: Motor strength 5/5 in upper and lower extremities. Sensation to fine touch intact all over. Cranial nerves II through XII intact.   MUSCULOSKELETAL: No joint swelling, redness, or effusion of the large joints. Normal muscle tone.   LYMPHATIC: No cervical or inguinal lymphadenopathy.   LABORATORY, DIAGNOSTIC, AND RADIOLOGICAL DATA: Lab results show glucose 108, BUN 25, creatinine 1.19, sodium 140, potassium 4.2, chloride 108. WBC 13.3, hemoglobin 14.9, platelets 92, neutrophils 91%. INR 1.2.   EKG shows atrial fibrillation.   ASSESSMENT AND PLAN:  1. Right lower extremity cellulitis: The patient does have chronic lymphedema which puts him at high risk for cellulitis. Possible source is his chronic ulcers and also the onychomycosis the patient has. He does have sepsis secondary to cellulitis with tachycardia and elevated white count. With questionable history of MRSA, the patient will be in isolation, started on vancomycin and meropenem for broad-spectrum antibiotics. We will send off blood cultures. The patient does not have any fluctuance. I do not suspect any abscess but we will get venous  Dopplers of the lower extremities to rule out deep vein thrombosis. Although the patient has been on Coumadin his INR is only 1.2. We will also consult the wound team for further help with managing the open ulcers.  2. Atrial fibrillation with rapid ventricular rate: Secondary to sepsis from the cellulitis. Continue rate control medications. Use IV medications as needed. The patient has been taking his Coumadin, but his INR is low. We will increase his Coumadin dose to 7.5 mg once a day and recheck INR. We will start the patient on therapeutic Lovenox until his INR is over 2.  3. Hypertension: Mildly elevated, likely secondary to the pain of his right lower extremity. Continue medications and monitor.  4. Deep vein thrombosis prophylaxis on Lovenox.  5. CODE STATUS: FULL CODE.   TIME SPENT: Time spent today on this case was more than 75 minutes with more than 50% of the time spent in coordination of care.    ____________________________ Molinda BailiffSrikar R. Thomos Domine, MD srs:bjt D: 07/18/2012 03:57:37 ET T: 07/18/2012 08:32:44 ET JOB#: [id number]  cc: Wardell HeathSrikar R. Laqueisha Catalina, MD, <Dictator> Teena Iraniavid M. Terance HartBronstein, MD Marcina MillardAlexander Paraschos, MD Orie FishermanSRIKAR R Gayleen Sholtz MD ELECTRONICALLY SIGNED 07/21/2012 13:13

## 2015-02-25 NOTE — Consult Note (Signed)
PATIENT NAME:  Nathaniel Simpson, Nathaniel Simpson MR#:  [id number] DATE OF BIRTH:  14-Apr-1951  DATE OF CONSULTATION:  07/21/2012  REFERRING PHYSICIAN:  Alford Highland, MD  CONSULTING PHYSICIAN:  Rosalyn Gess. Dayron Odland, MD  REASON FOR CONSULTATION: Lymphedema with cellulitis and C. difficile colitis.   HISTORY OF PRESENT ILLNESS: The patient is a 64 year old white man with a past history significant for chronic lymphedema of the lower extremities who was admitted on 09/10 with an approximately two week history of swelling and drainage from the right lower extremity. He had also noticed increased redness and foul discharge. He had previously been using compression on his lymphedema and had been fairly well controlled. He has had episodes of inflammation in the past. He had not been treated with any antibiotics prior to admission. He states that in addition to the symptoms of his lower extremities he has also been having some fevers and chills. He had also been having some nausea and diarrhea. He had some cramping abdominal pain as well. On admission he was started on vancomycin and meropenem. A C. difficile PCR was obtained which was positive and he has also been started on oral metronidazole.   ALLERGIES: Penicillin.   PAST MEDICAL HISTORY:  1. Chronic lymphedema in bilateral lower extremities.  2. Hypertension.  3. Atrial fibrillation.  4. Esophageal stricture status post dilatation.  5. Status post cholecystectomy.   SOCIAL HISTORY: The patient lives with his brother and sister. He does not smoke. He does not drink. He denies any injection drug use. He has dogs and a cat at home. He does not recall any recent scratches or bites from any of the animals.   FAMILY HISTORY: Positive for valvular disease and coronary artery disease.   REVIEW OF SYSTEMS: GENERAL: Positive fevers, chills, malaise, and fatigue. HEENT: No headaches. No sinus congestion. No sore throat. NECK: No stiffness. No swollen glands. RESPIRATORY: No  cough. No shortness of breath. No sputum production. CARDIAC: Positive nausea. No vomiting. Positive abdominal pain, mainly cramping. Positive loose stool. Positive melena. GU: No change in his urine. MUSCULOSKELETAL: No joint pain or complaints. SKIN: He has had increased redness, swelling, and drainage from the right lower extremity. The left lower extremity has been fairly stable. NEUROLOGIC: No focal weakness. PSYCHIATRIC: No complaints. All other systems are negative.   PHYSICAL EXAMINATION:   VITAL SIGNS: T-max 102.1, T-current 97.7, pulse 78, blood pressure 110/74, 96% on room air.   GENERAL: Obese 64 year old white man in no acute distress.   HEENT: Normocephalic, atraumatic. Pupils equal, reactive to light. Extraocular motion intact. Sclerae, conjunctivae, and lids are without evidence for emboli or petechiae. Oropharynx shows no erythema or exudate. Teeth and gums are in fair condition.   NECK: Supple. Full range of motion. Midline trachea. No lymphadenopathy. No thyromegaly.   CHEST: Clear to auscultation bilaterally with good air movement. No focal consolidation.   CARDIAC: Regular rate and rhythm without murmur, rub, or gallop.   ABDOMEN: Obese, soft, nontender, and nondistended. No hepatosplenomegaly. No hernia is noted.   EXTREMITIES: No evidence for tenosynovitis.   SKIN: He has bilateral edema in lower extremities. He has stasis changes on the left but no other significant rash. There are no ulcers on the right. There was some increased swelling. There was some increased redness although it had retreated from the prior mark by quite a bit. There was some serous drainage on the dressings but no purulence noted. There were no other rashes appreciated. There was no stigmata of endocarditis, specifically  no Janeway lesions or Osler nodes.   NEUROLOGIC: The patient is awake and interactive, moving all four extremities.   PSYCHIATRIC: Mood and affect appeared normal.   LABORATORY  DATA: BUN 17, creatinine 0.97, bicarbonate 24, anion gap 9, white count 8.3 with a hemoglobin of 13.1, platelet count 77, ANC 6.8. White count on admission was 13.3. Blood cultures from admission show no growth. C. difficile PCR is positive.   A bilateral lower extremity ultrasound showed no DVT.   IMPRESSION: This is a 64 year old white man with a history of chronic lower extremity lymphedema admitted with right lower extremity cellulitis and C. difficile colitis.   RECOMMENDATIONS:  1. His leg has been doing better over the past few days. He is on extremely broad coverage which likely put significant selective pressure on his bowel flora. He came into the hospital already having diarrhea. He denies any prior recent antibiotic use.  2. Will change him to doxycycline orally. He has a penicillin allergy. There has been no history of resistant gram-positive cocci nor Pseudomonas. While we will cover for MRSA, this regimen will not cover Pseudomonas. This will still have some selective pressure on the bowel flora but is less broad.  3. Will plan on treating him for 10 days total with doxycycline (through 09/20).  4. Would continue metronidazole for 10 days after completing the Keflex (through 09/30).  5. Would maintain contact precautions.   This is a moderately complex Infectious Disease case.   Thank you very much for involving me in Mr. Eben BurowJernigan's care.   ____________________________ Rosalyn GessMichael E. Yaacov Koziol, MD meb:drc D: 07/21/2012 15:03:16 ET T: 07/21/2012 15:24:03 ET JOB#: [id number]  cc: Rosalyn GessMichael E. Lenise Jr, MD, <Dictator> Azell Bill E Oletta Buehring MD ELECTRONICALLY SIGNED 07/24/2012 8:38

## 2015-02-25 NOTE — Discharge Summary (Signed)
PATIENT NAME:  Nathaniel Simpson, Nathaniel Simpson MR#:  [id number] DATE OF BIRTH:  1951-08-10  DATE OF ADMISSION:  07/18/2012 DATE OF DISCHARGE:  07/23/2012  ADMITTING DIAGNOSIS: Right lower extremity swelling, redness, a large ulcer.   DISCHARGE DIAGNOSES:  1. Clinical sepsis with increased pulse, leukocytosis, fevers likely due to cellulitis involving the right lower extremity as well as Clostridium difficile colitis. The patient's cellulitis significantly improved. He was seen by ID. They have recommended changing him to doxycycline b.i.d.  2. Clostridium difficile colitis. The patient will be continued on Flagyl.  3. Chronic venostasis with edema.  4. Chronic atrial fibrillation, on chronic Coumadin.  5. Morbid obesity.  6. Chronic thrombocytopenia.  7. Right arm growth. Primary care provider needs to arrange for Dermatology evaluation.  8. History of esophageal stricture, status post dilation in 2005.  9. Status post cholecystectomy.   PERTINENT LABS AND EVALUATIONS: Admitting glucose 108, BUN 25, creatinine 1.19, sodium 140, potassium 4.2, chloride 108, CO2 26, calcium 8.4. Admitting WBC count 13.3, hemoglobin 14.9, platelet count 92. His INR on presentation was 1.2. INR today is 2.3. Blood cultures x2 no growth. C. difficile on September 12th showed C. difficile positive.   Ultrasound of the lower extremities showed no evidence of DVT in the right or the left lower extremity.   CONSULTANTS:  1. Dr. Leavy Cella  2. Wound Care nurse    HOSPITAL COURSE: Please refer to the history and physical done by the admitting physician. The patient is a 64 year old morbidly obese Caucasian male with history of chronic atrial fibrillation, bilateral lower extremity edema, and right lower extremity chronic ulcer who presented to the ED with worsening swelling of the right lower extremity along with foul-smelling discharge and redness. The [facility]. The patient was noted to have elevated  WBC count on presentation. He also was tachycardic. There was concern for possible clinical sepsis. He was started on broad-spectrum antibiotics for his cellulitis and possible sepsis. His lower extremity swelling and erythema all started to improve. He was seen in consultation by Wound Care nurses and specific orders for his wound were initiated. The cellulitis has seemed to have resolved. The patient also had diarrhea ongoing even prior to hospitalization. He was checked for Clostridium difficile and Clostridium difficile was positive. Due to him having C. difficile as well as cellulitis, an ID consult was obtained with Dr. Leavy Cella who recommended placing the patient on p.o. doxycycline as well as continuing p.o. Flagyl for his Clostridium difficile. The patient is doing much better and is very anxious to go home. He is stable for discharge.   DISCHARGE MEDICATIONS:  1. Cardizem CD 240 daily.  2. Allegra 1 tab p.o. daily.  3. Lasix 20 2 tabs daily.  4. Symbicort 2 puffs b.i.d.  5. Metoprolol succinate 50 mg 1 tab p.o. daily.  6. Coumadin 5 mg daily.  7. Flagyl 500 1 tab p.o. q.8 hours, stop date 09/30.  8. Tylenol 650 q.4 p.r.n. pain. 9. Doxycycline 100 1 tab p.o. b.i.d. x5 days, stop date 09/20.  DRESSING: The patient is to wash both lower extremities with Hibiclens daily, rinse with tap water and pat dry to right leg. Apply Silvadene cream to open areas and cover with Telfa dressing or ABD pads if drainage is heavy. Secure the dressing with Kerlix wrap and tape.   DIET: Low sodium.   ACTIVITY: As tolerated.   FOLLOW-UP:  1. Follow-up with Dr. Terance Hart in 1 to 2 weeks. 2. Follow-up with  the [facility] next week.  3. The patient is to have his INR checked as previously.       TIME SPENT: 45 minutes.   ____________________________ Lacie ScottsShreyang H. Allena KatzPatel, MD shp:drc D: 07/23/2012 13:43:58 ET T: 07/24/2012 08:59:04 ET JOB#: [id number]  cc: Nathaniel Milbourn H. Allena KatzPatel, MD, <Dictator> Teena Iraniavid M.  Terance HartBronstein, MD [facility] Charise CarwinSHREYANG H Toney Difatta MD ELECTRONICALLY SIGNED 07/27/2012 21:48

## 2015-03-01 NOTE — Consult Note (Signed)
Chief Complaint:  Subjective/Chief Complaint seen for melena in the setting or remote h/o etoh abuse, noen for 20 years.  stable, not recurrent since admission.  Some loose stool today (brown)  no abdominal pain or nausea, tolerating clears.   VITAL SIGNS/ANCILLARY NOTES: **Vital Signs.:   31-Mar-15 13:06  Vital Signs Type Routine  Temperature Temperature (F) 98.3  Celsius 36.8  Respirations Respirations 20  Systolic BP Systolic BP 374  Diastolic BP (mmHg) Diastolic BP (mmHg) 66  Mean BP 79  Pulse Ox % Pulse Ox % 96  Pulse Ox Activity Level  At rest  Oxygen Delivery Room Air/ 21 %  *Intake and Output.:   31-Mar-15 11:52  Stool  had bowel movement, mostly formed, as per patient no blood   Brief Assessment:  GEN obese   Cardiac Regular   Respiratory clear BS   Gastrointestinal details normal Soft  Nontender  Nondistended  Bowel sounds normal   Lab Results: Routine Chem:  30-Mar-15 04:35   Glucose, Serum 82  BUN 13  Creatinine (comp) 1.07  Sodium, Serum 139  Potassium, Serum 3.8  Chloride, Serum  108  CO2, Serum 24  Calcium (Total), Serum  8.0  Anion Gap 7  Osmolality (calc) 277  eGFR (African American) >60  eGFR (Non-African American) >60 (eGFR values <57m/min/1.73 m2 may be an indication of chronic kidney disease (CKD). Calculated eGFR is useful in patients with stable renal function. The eGFR calculation will not be reliable in acutely ill patients when serum creatinine is changing rapidly. It is not useful in  patients on dialysis. The eGFR calculation may not be applicable to patients at the low and high extremes of body sizes, pregnant women, and vegetarians.)  Routine Hem:  30-Mar-15 04:35   WBC (CBC) 3.8  RBC (CBC)  3.86  Hemoglobin (CBC)  12.3  Hematocrit (CBC)  35.3  Platelet Count (CBC)  65  MCV 91  MCH 32.0  MCHC 35.0  RDW  16.7  Neutrophil % 70.8  Lymphocyte % 18.6  Monocyte % 7.4  Eosinophil % 2.3  Basophil % 0.9  Neutrophil # 2.7   Lymphocyte #  0.7  Monocyte # 0.3  Eosinophil # 0.1  Basophil # 0.0 (Result(s) reported on 04 Feb 2014 at 05:21AM.)   Assessment/Plan:  Assessment/Plan:  Assessment 1) melena-GU noted on EGD.  No recurrent melena since the day of admission.    2) h/o remote etoh abuse, cirrhosis.   Plan 1) continue bid ppi and carafate.  advance diet as tolerated, possible d/c today.  will need GI fu as o/p, sees Dr OBarry Brunner Discussed with Dr ABridgette Habermann   Electronic Signatures: SLoistine Simas(MD)  (Signed 31-Mar-15 13:19)  Authored: Chief Complaint, VITAL SIGNS/ANCILLARY NOTES, Brief Assessment, Lab Results, Assessment/Plan   Last Updated: 31-Mar-15 13:19 by SLoistine Simas(MD)

## 2015-03-01 NOTE — Consult Note (Signed)
Brief Consult Note: Diagnosis: melena.   Patient was seen by consultant.   Consult note dictated.   Recommend further assessment or treatment.   Orders entered.   Comments: Please see full Gi consult [phone]#[id number].  64 yo male with h/o etoh (remote) related cirrhosis and current melanoma stage 3 ([facility]), presenting with black stool in the setting of nsaid use.  No abd pain or hematemesis.  Hemodynamically stable.  no recurrent melena/bm since about 3-4 am.  On iv ppi and octreotide.  Continue current, EGD tomorrow.  I have discussed the risks benefits and complications of egd to inclued not limited to bleeding infection perforation and sedation and he wishes to proceed.  I will need to recheck plt in am before proceedure and if under 60, will need plt tfx.  Electronic Signatures: Barnetta ChapelSkulskie, Tyson Masin (MD)  (Signed 29-Mar-15 18:52)  Authored: Brief Consult Note   Last Updated: 29-Mar-15 18:52 by Barnetta ChapelSkulskie, Makenzie Weisner (MD)

## 2015-03-01 NOTE — H&P (Signed)
PATIENT NAME:  Nathaniel Simpson, Nathaniel Simpson MR#:  [id number] DATE OF BIRTH:  31-Oct-1951  DATE OF ADMISSION:  04/04/2014  ADMITTING PHYSICIAN: Dr. Gladstone Lighter  PRIMARY CARE PHYSICIAN: Dr. Juluis Pitch at [place]: Altered mental status and melena.   HISTORY OF PRESENT ILLNESS: Nathaniel Simpson is a 64 year old, unfortunate Caucasian male, with past medical history significant for alcoholic liver cirrhosis, history of AFib, not on anticoagulation, metastatic melanoma with brain mets, and recent intracranial hemorrhage about 10 days ago, with right hemiparesis and aphasia. Was sent to rehab from [facility], and brought in today secondary to rectal bleed and also altered mental status.   The patient is unable to provide any history because of confusion, and he is nonverbal since his stroke 10 days ago. His family at bedside is able to provide the history. The patient was just admitted a few months ago here for melena, and had an EGD done by GI at that time. It showed gastric ulcers, but no varices at that time. The aide at the nursing home noted had the patient's diaper had some fresh blood, and he had some dark stools over the last couple of days, so was brought to the hospital. He was noted to be tachycardic, with frank UTI based on labs. The patient has had a Foley catheter indwelling for the last 10 days since his intracranial hemorrhage and his discharge from [facility]. He is noted to be hypernatremic, dehydrated, with elevated WBC, and he is being admitted for sepsis. He also has elevated lactic acid. The patient is a DNR.   PAST MEDICAL HISTORY: 1.  Metastatic melanoma, with extremely poor prognosis per his oncologist as per family.   2.  Bilateral lower extremity edema.  3.  Atrial fibrillation, not on anticoagulation.   4.  Alcoholic liver cirrhosis and fatty liver changes.  5.  Hypertension.  6.  Esophageal stricture history. 7.  Intracranial hemorrhage with resultant right  hemiparesis and aphasia.   PAST SURGICAL HISTORY: Cholecystectomy.   ALLERGIES: PENICILLIN causes rash.   CURRENT HOME MEDICATIONS:  1.  Decadron 3 mg p.o. b.i.d.  2.  Cardizem 60 mg p.o. q. 6 hours.  3.  Lasix 40 mg p.o. b.i.d.  4.  Imdur 30 mg p.o. 3 times a day.  5.  Lactulose 30 mL twice a day.  6.  Metoprolol 25 mg p.o. 3 times a day.  7.  Nystatin suspension 4 times a day.  8.  Oxycodone 5 mg q. 6 hours.  9.  Protonix 40 mg p.o. daily.  10.  Sodium chloride 1000 mg 3 tablets 3 times a day.  11.  Spironolactone 25 mg p.o. b.i.d.  12.  Xifaxan 550 mg daily.   SOCIAL HISTORY: Closest family member is brother and sister-in-law. Currently from [employer]. He needs assistance with everything and is bedbound at baseline since his intracranial hemorrhage 10 days ago. No smoking or alcohol use.   FAMILY HISTORY: Significant for valve surgery in father and bladder surgery in brother.   REVIEW OF SYSTEMS:  Difficult to obtain secondary to his confusion and aphasia.    PHYSICAL EXAMINATION: VITAL SIGNS: Temperature 99.2 degrees Fahrenheit, pulse 114, respirations 28, blood pressure 121/68, pulse ox 94% on room air.  GENERAL: A heavily built, well-nourished male lying in bed, very drowsy appearing.   HEENT: Normocephalic, atraumatic. Pupils equal, round, reacting to light. Icteric sclerae present. Extraocular movements intact. Oropharynx with dry mucous membranes.  NECK: Supple. No thyromegaly, JVD, or  carotid bruits. No lymphadenopathy.  LUNGS: Moving air bilaterally. No wheeze or crackles. No use of accessory muscles for breathing. Decreased bibasilar breath sounds.  CARDIOVASCULAR: S1, S2. Regular rate and rhythm. No murmurs, rubs, or gallops.  ABDOMEN: Obese, soft, nontender, nondistended. No hepatosplenomegaly. Normal bowel sounds.  EXTREMITIES: Have 1+ edema, and difficult to palpate dorsalis pedis pulses. Chronic discoloration noted, likely from chronic bilateral  lower extremity edema.  LYMPH NODES:  No cervical lymphadenopathy.  NEUROLOGIC: The patient is altered at this time. He is completely paralyzed on his right side, able to move the left arm involuntarily. Not following any commands. Did not move his left leg for me, thought his family states he moved it a little at the nursing home.  PSYCHOLOGICAL: The patient is disoriented at this time.   LAB DATA: WBC 23.6, hemoglobin 15.4, hematocrit 46.2, platelet count is 99. Sodium 150, potassium 4.5, chloride 117, bicarb 23, BUN 53, creatinine 1.07, glucose 172, calcium of 8.6.   ALT 173, AST 122, alk phos 176, total bili 4.3, and albumin of 2.9. INR is 1.6. Urinalysis with nitrite positive, 3+ leuk esterase, 1+ bacteria, and 623 WBCs.  Lactic acid is 2.1.   Chest x-ray showing clear lung fields, no acute cardiopulmonary disease.   ASSESSMENT AND PLAN: This is a 64 year old male with metastatic melanoma and recent intraventricular hemorrhage with right hemiparesis, aphasic at baseline, brought in for rectal bleed and also noted to be septic.  1. Sepsis, secondary to urinary tract infection likely. Recent indwelling Foley placed for 10 days now at [facility], since his intracranial hemorrhage. Change Foley catheter, follow blood and urine cultures, and started on antibiotics and IV hydration.   2. Hypernatremia. The patient takes salt tablets due to his chronic hyponatremia at baseline, so will hold them. Start D5 1/2 normal saline and monitor in morning.  3.  Rectal bleed. Has history of liver cirrhosis. Recent EGD, with no varices. Hold off on octreotide drip. Start IV Protonix b.i.d. Hemoglobin is stable for now. Hemoccult ordered, and unless hemoglobin drops, will hold off on GI consult.   4.  Hypertension. Hold meds as hypotensive  5.  Altered mental status secondary to metabolic encephalopathy from sepsis.   6.  Metastatic melanoma. Poor prognosis, per his oncologist from [facility]. Not in treatment for now.    CODE STATUS:  He is DO NOT RESUSCITATE.  Time spent on admission: 50 minutes.    ____________________________ Gladstone Lighter, MD rk:mr D: 04/04/2014 17:25:20 ET T: 04/04/2014 19:33:11 ET JOB#: [id number]  cc: Gladstone Lighter, MD, <Dictator> Youlanda Roys. Lovie Macadamia, MD    Gladstone Lighter MD ELECTRONICALLY SIGNED 04/05/2014 12:42

## 2015-03-01 NOTE — Consult Note (Signed)
Chief Complaint:  Subjective/Chief Complaint seen for melena.  denies abdominal pain or nausea, no bm since yesterday.   VITAL SIGNS/ANCILLARY NOTES: **Vital Signs.:   30-Mar-15 13:44  Vital Signs Type Routine  Temperature Temperature (F) 98.5  Celsius 36.9  Temperature Source oral  Respirations Respirations 18  Systolic BP Systolic BP 035  Diastolic BP (mmHg) Diastolic BP (mmHg) 69  Mean BP 86  Pulse Ox % Pulse Ox % 97  Pulse Ox Activity Level  At rest  Oxygen Delivery Room Air/ 21 %  *Intake and Output.:   30-Mar-15 10:54  Stool  pt had a small formed light brown bowel movement.   Brief Assessment:  Cardiac Regular   Respiratory clear BS   Gastrointestinal details normal Soft  Nontender  Nondistended  No masses palpable  Bowel sounds normal   Lab Results: Routine Chem:  30-Mar-15 04:35   Glucose, Serum 82  BUN 13  Creatinine (comp) 1.07  Sodium, Serum 139  Potassium, Serum 3.8  Chloride, Serum  108  CO2, Serum 24  Calcium (Total), Serum  8.0  Anion Gap 7  Osmolality (calc) 277  eGFR (African American) >60  eGFR (Non-African American) >60 (eGFR values <74m/min/1.73 m2 may be an indication of chronic kidney disease (CKD). Calculated eGFR is useful in patients with stable renal function. The eGFR calculation will not be reliable in acutely ill patients when serum creatinine is changing rapidly. It is not useful in  patients on dialysis. The eGFR calculation may not be applicable to patients at the low and high extremes of body sizes, pregnant women, and vegetarians.)  Routine Coag:  28-Mar-15 18:55   INR 1.3 (INR reference interval applies to patients on anticoagulant therapy. A single INR therapeutic range for coumarins is not optimal for all indications; however, the suggested range for most indications is 2.0 - 3.0. Exceptions to the INR Reference Range may include: Prosthetic heart valves, acute myocardial infarction, prevention of  myocardial infarction, and combinations of aspirin and anticoagulant. The need for a higher or lower target INR must be assessed individually. Reference: The Pharmacology and Management of the Vitamin K  antagonists: the seventh ACCP Conference on Antithrombotic and Thrombolytic Therapy. CDHRCB.6384Sept:126 (3suppl): [id number] A HCT value >55% may artifactually increase the PT.  In one study,  the increase was an average of 25%. Reference:  "Effect on Routine and Special Coagulation Testing Values of Citrate Anticoagulant Adjustment in Patients with High HCT Values." American Journal of Clinical Pathology 2006;126:400-405.)  Routine Hem:  30-Mar-15 04:35   WBC (CBC) 3.8  RBC (CBC)  3.86  Hemoglobin (CBC)  12.3  Hematocrit (CBC)  35.3  Platelet Count (CBC)  65  MCV 91  MCH 32.0  MCHC 35.0  RDW  16.7  Neutrophil % 70.8  Lymphocyte % 18.6  Monocyte % 7.4  Eosinophil % 2.3  Basophil % 0.9  Neutrophil # 2.7  Lymphocyte #  0.7  Monocyte # 0.3  Eosinophil # 0.1  Basophil # 0.0 (Result(s) reported on 04 Feb 2014 at 05:21AM.)   Assessment/Plan:  Assessment/Plan:  Assessment 1) melena in the setting of h/o cirrhosis and nsaid use. hemodynamically stable,  no evidence of ongoing bleeding. on ppi and octreotide drips.   Plan 1) EGD today. further recs to follow.   Electronic Signatures: SLoistine Simas(MD)  (Signed 30-Mar-15 16:17)  Authored: Chief Complaint, VITAL SIGNS/ANCILLARY NOTES, Brief Assessment, Lab Results, Assessment/Plan   Last Updated: 30-Mar-15 16:17 by SLoistine Simas(MD)

## 2015-03-01 NOTE — Consult Note (Signed)
PATIENT NAME:  Nathaniel Simpson, Nathaniel Simpson MR#:  [id number] DATE OF BIRTH:  18-Feb-1951  DATE OF CONSULTATION:  02/03/2014  REFERRING PHYSICIAN:   CONSULTING PHYSICIAN:  Christena Deem, MD  ATTENDING:  Krystal Eaton, MD  REASON FOR CONSULTATION: Melena/GI bleed.   HISTORY OF PRESENT ILLNESS: Nathaniel Simpson is a 64 year old Caucasian male with a history of remote alcohol abuse and cirrhosis of the liver. He presented with black/dark stools. He states that the bottom of his feet hurt from his chemotherapy agent (The patient has stage 3 melanoma and takes Dabrafenib.).  He states that this agent makes the bottom of his feet hurt and he has been taking some occasional ibuprofen for this. He generally takes it about every other day. He states that over the period of the past 3 days or so, he has seen multiple dark-appearing stools. He has had gastric ulcers in the past. He denies any problems with nausea or vomiting. He does have a history of gastroesophageal reflux as well as Barrett's esophagus, but apparently is not taking any medication for that. He does have an some occasional dysphagia, however, the difficulty swallowing, he states, is usually related to nasal congestion. He generally has a bowel movement about 3 to 4 times a day, however, his last stool was yesterday. There has been no further rectal bleeding today. There has been no hematemesis. He has had no abdominal pain. He had an EGD done twice in 2005 for dysphagia with dilatation done apparently at that time. He had an EGD last done in December 2013 along with a colonoscopy at that time. He was found to have gastric ulcers x 2 as well as evidence of Barrett's esophagus. He also was noted to have arteriovenous malformations of the colon as well as a tubular adenomatous colon polyp that was removed at the time. There was no high-grade dysplasia. He has been hemodynamically stable. He does have a history of thrombocytopenia.   PAST MEDICAL HISTORY: 1.   Hypertension.  2.  Atrial fibrillation.  He does not take an anticoagulant for this. He has been told he is too high risk for bleeding.  3.  Bilateral lower extremity lymphedema.  4.  Melanoma stage 3. He is currently under care of [facility] oncology.  5.  Remote alcohol abuse with history of cirrhosis of the liver. He is followed by Dr. Bluford Kaufmann [facility] Ms. Owens Shark at [facility]. He states he has not drank any alcohol, however, for about 20 or 25 years. He has a history of esophageal stricture as noted previously. He has had a cholecystectomy.   SOCIAL HISTORY: No current alcohol use. The patient does not use tobacco. There is no illicit drug use.   GI FAMILY HISTORY: Negative for colorectal cancer, liver disease or ulcers.   OUTPATIENT MEDICATIONS: Include acetaminophen 500 mg 1 q.8 hours p.r.n., Allegra 180 mg once a day p.r.n., topical antibiotic bacitracin polymyxin B, Dabrafenib 150 mg twice a day, diltiazem 240 mg 24-hour ER once a day, Lasix 20 mg 2 twice a day, ibuprofen 200 mg every 6 hours, isosorbide mononitrate 30 mg once a day, lactulose powder 10 grams twice a day, metoprolol succinate 50 mg 1.5 tablets daily, spironolactone 25 mg a day, Xifaxan 550 mg 1 tablet once a day. Again, he apparently is not on a proton pump inhibitor.   ALLERGIES: HE IS ALLERGIC TO ASPIRIN AND PENICILLIN.   REVIEW OF SYSTEMS: Ten systems reviewed per admission history and physical, agree with same.   PHYSICAL EXAMINATION: VITAL  SIGNS: Temperature is 98.3, pulse 71, respirations 19, blood pressure 118/77, pulse ox 97%.  GENERAL: He is a 64 year old Caucasian male in no acute distress.  HEENT: Normocephalic, atraumatic.  EYES: Anicteric.  NOSE: Septum midline. No lesions.  OROPHARYNX: Fair dentition.  NECK: Supple. No JVD.  HEART: Irregular rate and rhythm.  LUNGS: Bilaterally clear.  ABDOMEN: Soft, nontender, nondistended. Bowel sounds positive, normoactive.  EXTREMITIES:  Legs show lymphedema bilaterally, right more  so than left. There is no clubbing or cyanosis.  RECTAL: Anorectal exam is deferred. The patient has not had a bowel movement for over 12 hours. He was apparently Hemoccult positive in the ER.   ASSESSMENT: Melena in the setting of nonsteroidal anti-inflammatory drug use and history of cirrhosis of the liver. The patient currently is hemodynamically stable and he has been placed on a Protonix drip. He is also on octreotide due to his history of cirrhosis. It is of note that he did not have varices noted on his last esophagogastroduodenoscopy.  The patient does have a history of thrombocytopenia and this will need to be checked immediately prior to his procedure again. Recommend to proceed with esophagogastroduodenoscopy tomorrow. I have discussed the risks, benefits and complications of esophagogastroduodenoscopy to include but not limited to bleeding, infection, perforation and the risk of sedation, and he wishes to proceed. Continue serial hemoglobins. Transfuse as needed. Continue proton pump inhibitors as you are. Continue octreotide as you are.   Thank you for this consult.   ____________________________ Christena DeemMartin U. Skulskie, MD mus:cs D: 02/03/2014 18:46:48 ET T: 02/03/2014 19:05:58 ET JOB#: [id number]  cc: Christena DeemMartin U. Skulskie, MD, <Dictator> Christena DeemMARTIN U SKULSKIE MD ELECTRONICALLY SIGNED 02/05/2014 13:31

## 2015-03-01 NOTE — H&P (Signed)
PATIENT NAME:  Nathaniel Simpson, Nathaniel Simpson MR#:  [id number] DATE OF BIRTH:  Jan 24, 1951  DATE OF ADMISSION:  02/03/2014  REFERRING PHYSICIAN:  Lurline Hare, MD   PRIMARY CARE PHYSICIAN:  Juluis Pitch, MD   PRIMARY CARDIOLOGIST:  Isaias Cowman, MD   PRIMARY ONCOLOGY:  Dr. Truman Hayward at [facility] oncology.   CHIEF COMPLAINT:  Melena.   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 The patient presents with complaints of melena. The patient reports he has been having melena for the last 3 days, complaining of dark tarry stools. The patient as well, reports some lightheadedness. The patient reports he has been taking ibuprofen recently over the last few weeks for complaints of feet pain. 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 Most recent endoscopy was done in September 2013, which did show gastric ulcer and there is no mentioning of any esophageal varices then. The patient denies any coffee-ground emesis, any bright red blood per rectum. The patient does not drink any more alcohol. He is known to have history of Barrett esophagus as well. The patient's hemoglobin came back at 12.5, most recent value known to [place] was before two years at 15.6, unclear what is his most recent baseline. As well, the patient was found to have thrombocytopenia with platelet count of 79,000. The patient is known to have history of low platelet count, chronic thrombocytopenia with last value known to [place] was at 68,000.   PAST MEDICAL HISTORY: 1.  Atrial fibrillation.  2.  Hypertension.  3.  Bilateral lower extremity lymphedema.  4.  Esophageal stricture, status post dilatation in 2005.  5.  Liver cirrhosis secondary to alcohol abuse.  6.  Melanoma, stage III,  followed at [facility].   ALLERGIES:  PENICILLIN, WHICH CAUSES HIVES.   PAST SURGICAL HISTORY:  Cholecystectomy.   SOCIAL HISTORY:  The patient denies any smoking.  No current alcohol abuse.  No illicit drug use.   FAMILY HISTORY:  Significant for valve replacement surgery in his father and CABG in his brother.   HOME MEDICATIONS: 1.  Spironolactone 25 mg oral 2 times a day.  2.  Tylenol as needed.  3.  Ibuprofen 200 mg every six hours as needed.  4.  Imdur 30 mg oral daily.  5.  Diltiazem extended release 240 mg oral daily.  6.  Allegra 180 mg oral daily.  7.  Metoprolol succinate 75 mg oral daily.  8.  Lasix 20 mg oral 2 tablets 2 times a day.  9.  Lactulose 15 mL three times a day.  10.  Xifaxan 550 oral 2 times a day.  11.  Dabrafenib 150 mg oral 2 times a day.   REVIEW OF SYSTEMS:  CONSTITUTIONAL:  Denies any fever, chills, fatigue, weakness, weight gain, weight loss.  EYES:  Denies blurry vision, double vision, inflammation, glaucoma.  EARS, NOSE, THROAT:  Denies tinnitus, ear pain, hearing loss, epistaxis or discharge.  RESPIRATORY:  Denies cough, wheezing, hemoptysis, dyspnea or COPD.  CARDIOVASCULAR:  Denies chest pain, arrhythmia, palpitations, syncope.  Has chronic lower extremity edema.  GASTROINTESTINAL:  Denies nausea, vomiting, diarrhea, abdominal pain, hematemesis.  Reports melena.  GENITOURINARY:  Denies dysuria, hematuria, renal colic.  ENDOCRINE:  Denies polyuria, polydipsia, heat or cold intolerance.  HEMATOLOGY:  Denies easy bruising, bleeding diathesis.  INTEGUMENTARY:  Denies acne, rash or skin lesion.  Reports history of melanoma.  MUSCULOSKELETAL:  Denies any gout, arthritis.   NEUROLOGIC:  Denies any history of CVA, TIA, tremors, headache.  Reports lightheadedness.  PSYCHIATRIC:  Denies anxiety, insomnia or depression or schizophrenia.   PHYSICAL EXAMINATION: VITAL SIGNS:  Temperature 98.7, pulse 78, respiratory rate 18, blood pressure 126/60, saturating 99%  on room air.  GENERAL:  Well-nourished male who looks comfortable in bed, in no apparent distress.  HEENT:  Head atraumatic, normocephalic.  Pupils equal, reactive to light.  Pink conjunctivae.  Anicteric sclerae.  Moist oral mucosa.  NECK:  Supple.  No thyromegaly.  No JVD.  CHEST:  Good air entry bilaterally.  No wheezing, rales, rhonchi.  CARDIOVASCULAR:  S1, S2 heard.  No rubs, murmurs, gallops.  Irregular, irregular.  ABDOMEN:  Obese, soft, nontender, nondistended.  Bowel sounds present.  No fluid wave or ascites could be appreciated.  EXTREMITIES:  Has bilateral lower extremity edema, right more than the left, which the patient says it has been chronic, which is at his baseline right now, left lower +1, right lower +2, with chronic skin discoloration. Pedal and radial pulses were felt bilaterally.  PSYCHIATRIC:  Appropriate affect.  Awake, alert, oriented x3.  Intact judgment and insight.  NEUROLOGIC:  Cranial nerves grossly intact.  Motor 5/5.  No focal deficit.  SKIN:  Normal skin turgor.  Warm and dry.  MUSCULOSKELETAL:  No joint effusion or erythema.   PERTINENT LABORATORY DATA:  Glucose 94, BUN 17, creatinine 1.23, sodium 139, potassium 3.5, chloride 106, CO2 of 27, ALT 22, AST 49, alk phos 214, total bili 2.5.  White blood cells 5.8, hemoglobin 12.5, hematocrit 36.1, platelets 79, INR 1.3 and EKG showing Afib at 82 beats per minute.   ASSESSMENT AND PLAN: 1.  Gastrointestinal bleed. The patient is most likely having lower gastrointestinal bleed as he presents with melena. His vital signs are stable currently. Hemoglobin has dropped from the last value a total of 3 grams, but the last value is before 2 years. Unclear what is his most recent baseline. The patient is known to have a history of gastric ulcers in the past, using nonsteroidal anti-inflammatory drugs so this is most likely nonsteroidal anti-inflammatory drug-induced upper gastrointestinal bleed. He will be started on Protonix  drip. We will monitor his hemoglobin every 8 hours and we will transfuse if needed. We will consult gastroenterology. As well, the patient is known to have history of liver cirrhosis, even though he does not have a history of esophageal varices, but his last EGD was in September 2013, so we will start him empirically on octreotide drip as well. We will keep him nothing by mouth except medications.  2.  Thrombocytopenia, appears to be chronic, at baseline.  This is most likely due to his liver cirrhosis.  No indication for transfusion currently.  3.  Melanoma, stage III.  The patient is on dabrafenib.  He is following at [facility], we will continue with his home medication.  4.  Atrial fibrillation, appears to be rate controlled.  We will continue the patient on metoprolol and Cardizem, and certainly he is not a candidate for anticoagulation due to his gastrointestinal bleed.  5.  Elevated liver function tests, liver cirrhosis. This is most likely alcohol induced. Currently, the patient does not drink any more alcohol. We will continue him on Xifaxan and lactulose. We will hold his spironolactone and Lasix until he is  more stable. We will avoid hepatotoxic medications.  6.  Hypertension. The blood pressure is acceptable. We will continue with Cardizem and metoprolol mainly for heart rate control.  7.  Chronic bilateral lower extremity lymphedema, appears to be at baseline as per patient.  8.  Deep vein thrombosis prophylaxis.  Sequential compression device and Venodyne.  No chemical anticoagulation due to his gastrointestinal bleed.  9.  CODE STATUS WAS DISCUSSED WITH THE PATIENT, REPORTS HE HAS A LIVING WILL AND HE STATES HE IS DO NOT RESUSCITATE.   Total time spent on admission and patient care 60 minutes.     ____________________________ Albertine Patricia, MD dse:ea D: 02/03/2014 01:21:35 ET T: 02/03/2014 03:11:50 ET JOB#: [id number]  cc: Albertine Patricia, MD, <Dictator> Maud Rubendall Graciela Husbands  MD ELECTRONICALLY SIGNED 02/04/2014 0:10

## 2015-03-01 NOTE — Discharge Summary (Signed)
PATIENT NAME:  Nathaniel Simpson, Nathaniel Simpson MR#:  [id number] DATE OF BIRTH:  10-16-1951  DATE OF ADMISSION:  02/03/2014 DATE OF DISCHARGE:  02/05/2014  PRIMARY CARE PHYSICIAN: Teena Irani. Terance Hart, MD  PRIMARY CARDIOLOGIST: Marcina Millard, MD  CHIEF COMPLAINT: Melena.   DISCHARGE DIAGNOSES:  1. Acute upper gastrointestinal bleed, likely due to gastric ulcer.  2. Chronic thrombocytopenia.  3. Melanoma, stage III.  4. Liver cirrhosis. 5. Chronic atrial fibrillation, not on anticoagulation.  6. Hypertension.  7. Chronic lower extremity lymphedema.  8. Hypertension.  9. History of esophageal stricture, status post dilation in 2005.   DISCHARGE MEDICATIONS:  1. Metoprolol succinate 50 mg 1-1/2 tab once a day 2. Allegra 180 mg once a day.  3. Bacitracin/polymyxin apply topically to affected areas.  4. Diltiazem 240 mg extended release once a day.  5. Furosemide 20 mg 2 tabs 2 times a day.  6. Isosorbide mononitrate 30 mg once a day in the morning.  7. Lactulose 10 grams oral powder 2 times a day. 8. Xifaxan 550 mg 1 tab once a day.  9. Spironolactone 25 mg 2 times a day. 10. Dabrafenib 100 mg 2 times a day.  11. Pantoprazole 40 mg 2 times a day for 30 days, then once a day.  Please stop taking acetaminophen and ibuprofen.   DIET: Low sodium, low fat, low cholesterol, soft diet for a week, then regular consistency.   ACTIVITY: As tolerated.   FOLLOWUP: Please follow with PCP within 1 to 2 weeks. Please follow with GI within 2 to 4 weeks.   DISPOSITION: Home.   SIGNIFICANT LABORATORIES AND IMAGING: Initial BUN 17, creatinine 1.23, sodium 139, potassium 3.5. Initial white count of 5.8, hemoglobin 12.5. Last hemoglobin of 12.3. Initial platelets of 79. INR initially was 1.3.  HISTORY OF PRESENT ILLNESS AND HOSPITAL COURSE: For full details of H and P, please see the dictation on March 29 by Dr. Randol Kern, but briefly, this is a pleasant 64 year old obese male with history of cirrhosis, chronic  lower extremity lymphedema, chronic atrial fibrillation, not on anticoagulation, chronic thrombocytopenia and history of melanoma, being followed at [facility], who came in with complaint of melena for the last 3 days, dark tarry stools and some lightheadedness. The patient had been taking ibuprofen recently. He does have history of cirrhosis secondary to alcohol abuse. He was noted to have a hemoglobin in the mid 12s initially. Given the chronic thrombocytopenia and lightheadedness, he was admitted to the hospitalist service. Given the history of cirrhosis, he was started on octreotide in addition to Protonix drip. As we initially could not rule out variceal bleed, GI was consulted. He was also started on gentle fluids initially as well. He was seen by Dr. Marva Panda, and the patient underwent an EGD on the 30th of March showing erythematous mucosa in the gastric body and antrum with a gastric ulcer with clean base, likely the source of his melena. The patient required no blood transfusion as he remained hemodynamically stable. He was instructed not to take any NSAIDs, and he was discharged without his Tylenol that he has been taking, given his cirrhosis. He remained rate controlled for his atrial fibrillation. His blood pressure was in acceptable range. He does have chronic bilateral lower extremity lymphedema. At this point, he has been tolerating advanced diet and will be discharged.   TOTAL TIME SPENT: 35 minutes.   ____________________________ Krystal Eaton, MD sa:lb D: 02/06/2014 08:17:38 ET T: 02/06/2014 08:31:53 ET JOB#: [id number]  cc: Krystal Eaton, MD, <Dictator>  Teena Iraniavid M. Terance HartBronstein, MD Krystal EatonSHAYIQ Verina Galeno MD ELECTRONICALLY SIGNED 02/20/2014 10:14

## 2015-03-01 NOTE — Discharge Summary (Signed)
PATIENT NAME:  Nathaniel Simpson, [name] MR#:  [id number] DATE OF BIRTH:  [birth year]  DATE OF ADMISSION:  04/04/2014 DATE OF DISCHARGE:  04/06/2014  PRESENTING COMPLAINT: Altered mental status and melena.   DISPOSITION: The patient was discharged to hospice home.  CODE STATUS: No code, DNR.  FINAL DIAGNOSES: 1.  Catheter-related urinary tract infection and sepsis.  2.  Hypernatremia.  3.  Dehydration.  4.  Rectal bleed.  5.  Hypertension.  6.  Metastatic melanoma.   CONSULTATIONS: Palliative care with Dr. Harvie JuniorPhifer.   HOSPITAL COURSE: Mr. 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 He was admitted with catheter-related UTI and sepsis. The patient was started initially on IV antibiotics and IV fluids. The patient has been aggressively treated. Palliative care had a long discussion with the patient's brother and sister-in-law. They have decided, given multiple medical issues along with recent intraventricular hemorrhage and overall poor prognosis, they decided on comfort measures as that is what the patient would have wanted per the patient's brother and sister-in-law. The patient remained a no code, DNR. He was discharged and the family did agree for the patient to go to the hospice facility. Arrangements were made. The patient remained comfortable while in the hospital.   TIME SPENT: 30 minutes.   ____________________________ Wylie HailSona A. Allena KatzPatel, MD sap:sb D: Apr 16, 2014 06:55:33 ET T: Apr 16, 2014 07:24:05 ET JOB#: [id number]  cc: Sou Nohr A. Allena KatzPatel, MD, <Dictator> Willow OraSONA A Oluwatobi Visser MD ELECTRONICALLY SIGNED 04/22/2014 13:20

## 2015-05-29 IMAGING — CT CT HEAD WITHOUT CONTRAST
2 series · 14 of 30 positions shown, 16 images · non-contrast
Comparison: DG CERVICAL SPINE COMPLETE 4+V dated 01/22/2012

CLINICAL DATA: Expressive aphasia

EXAM:
CT HEAD WITHOUT CONTRAST
TECHNIQUE: Contiguous axial images were obtained from the base of the skull
through the vertex without intravenous contrast.

[Series 2: head bone · axial · 0.43mm/px · z∈[-74,+80]mm · 8 of 95 slices shown]
[im 9/95  bone]
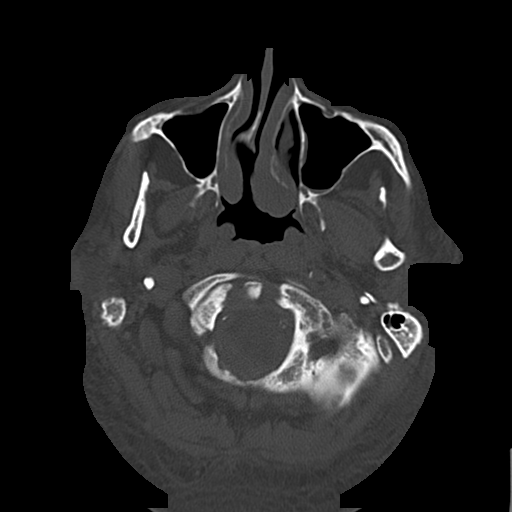
[im 18/95  bone]
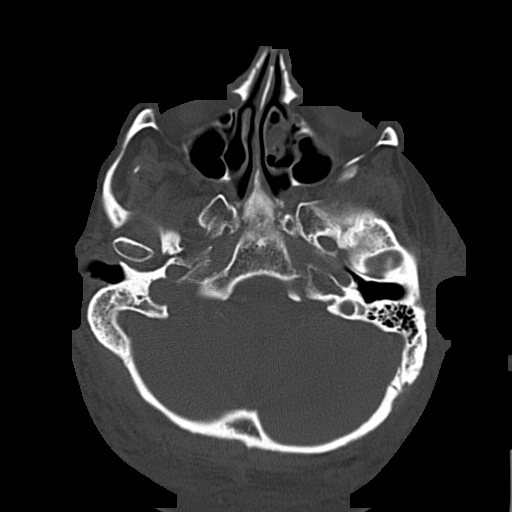
[im 32/95  bone]
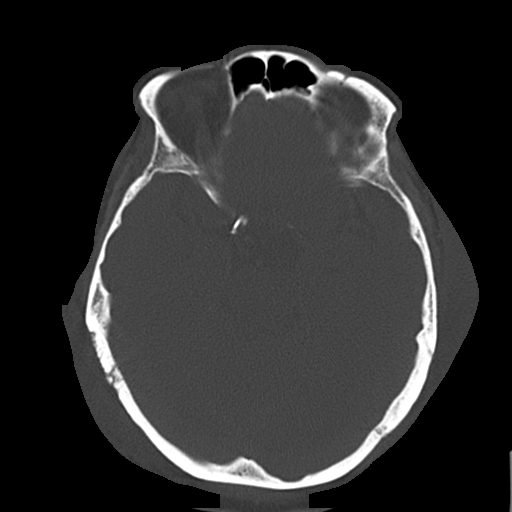
[im 41/95  bone]
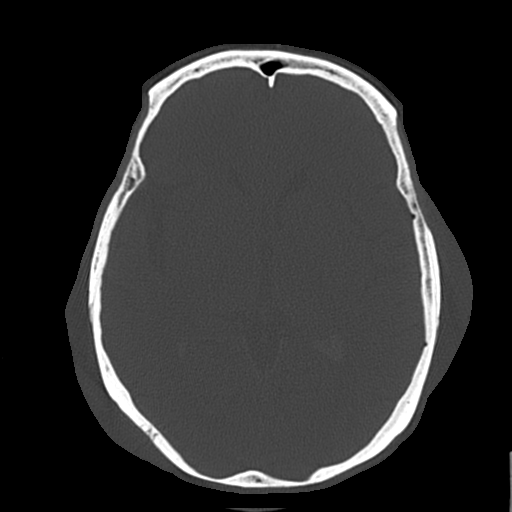
[im 54/95  bone]
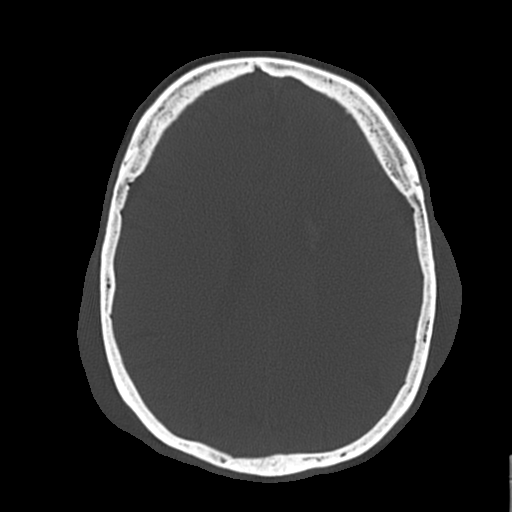
[im 63/95  bone]
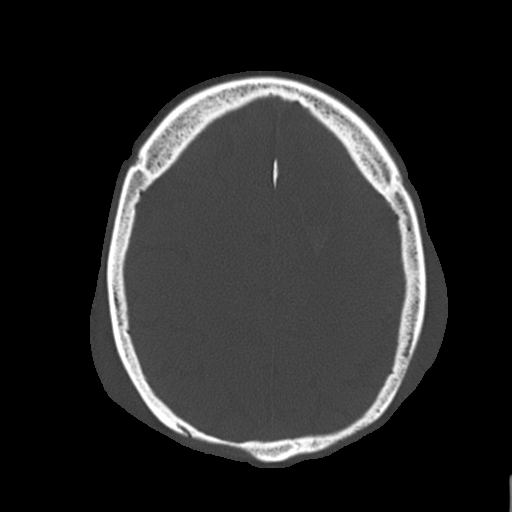
[im 77/95  bone]
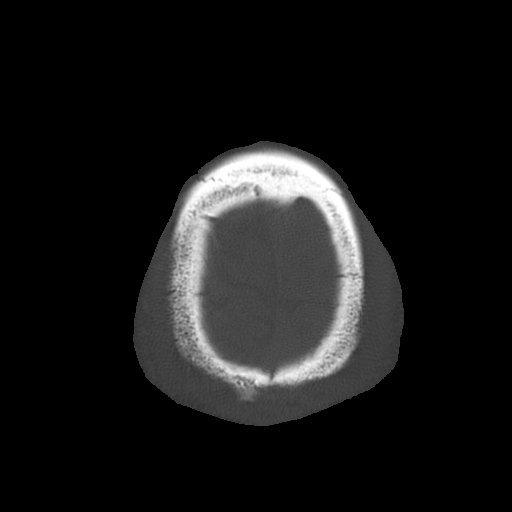
[im 86/95  bone]
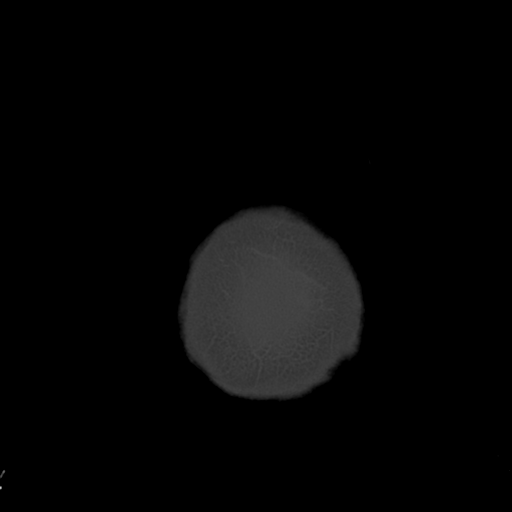

[Series 3: head wo · axial · 0.43mm/px · z∈[-60,+64]mm · 6 of 35 slices shown, 8 images]
[im 5/35  brain]
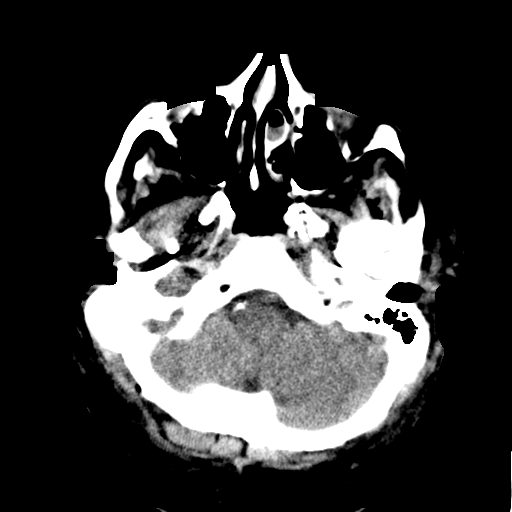
[im 5/35  bone]
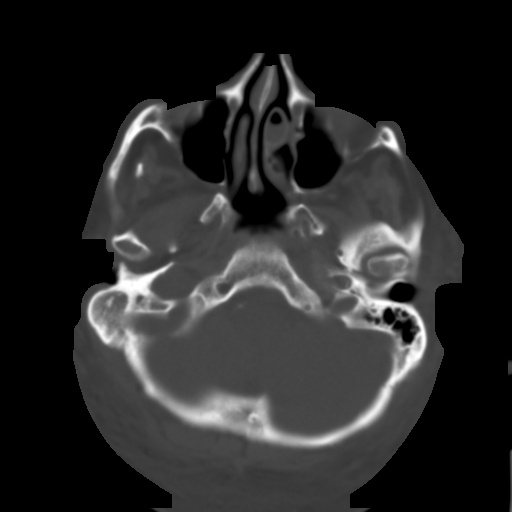
[im 10/35  brain]
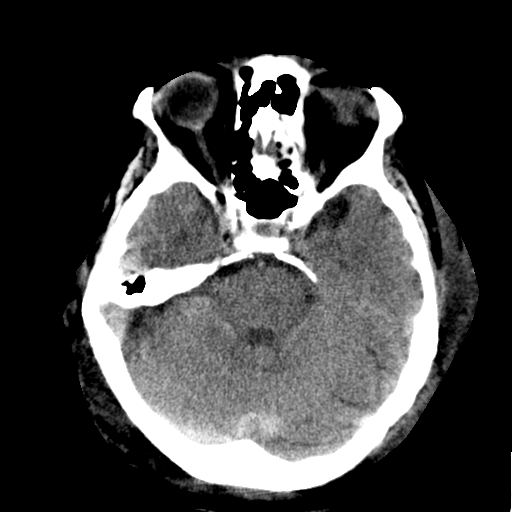
[im 15/35  brain]
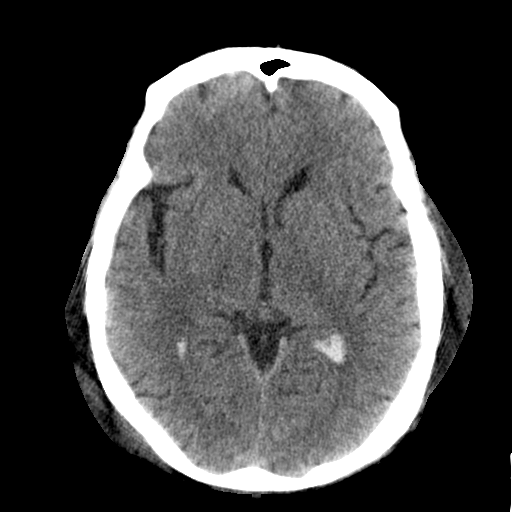
[im 20/35  brain]
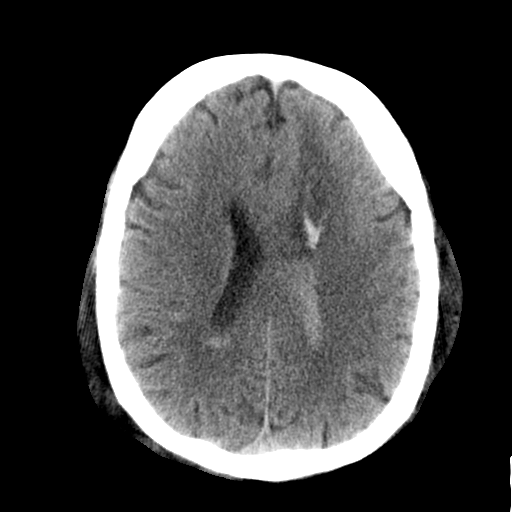
[im 25/35  brain]
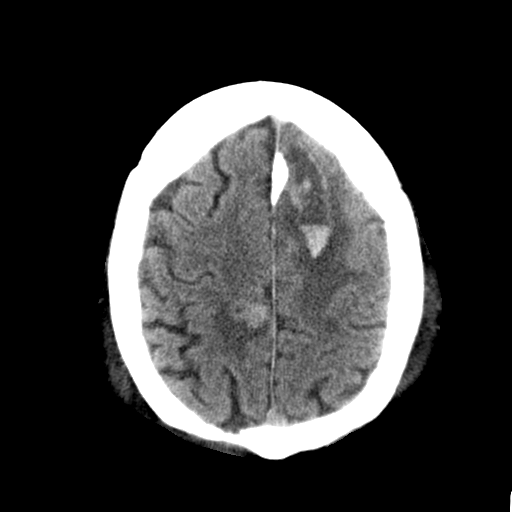
[im 25/35  bone]
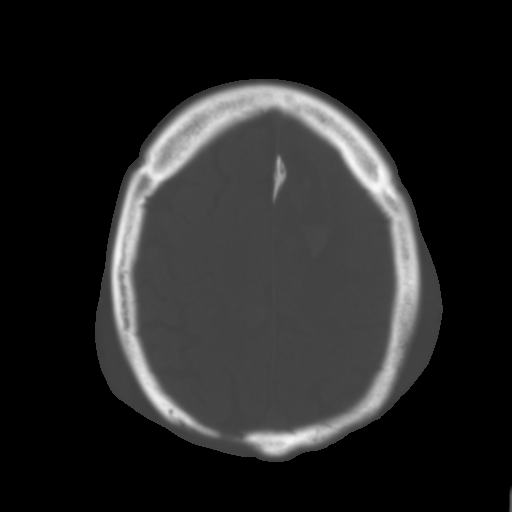
[im 30/35  brain]
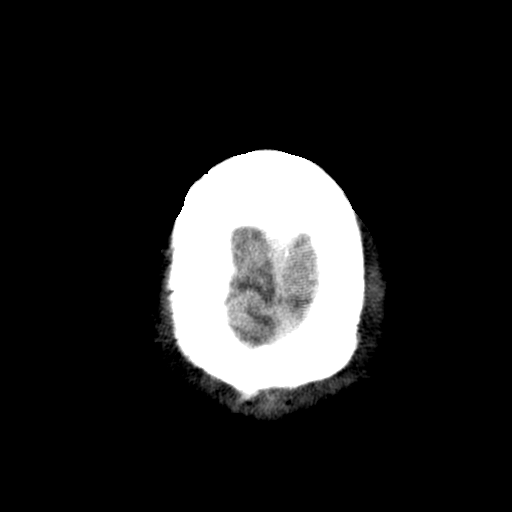

[14 of 30 positions shown; findings below may reference images not displayed]

FINDINGS: Acute intraparenchymal hemorrhage within the left frontal lobe
measuring 4.9 x 1.7 cm with layering fluid fluid level and
surrounding edema. Additionally there is a 1.1 x 1.0 cm focus of
hemorrhage within the high right parietal lobe with surrounding
edema. There is intraventricular extent of the hemorrhage within the
left greater than right lateral ventricles. Basal cisterns are
patent. No significant midline shift. Orbits are unremarkable. Left
ethmoid and maxillary sinus mucosal thickening. Mastoid air cells
are well aerated.
IMPRESSION: 1. Acute intraparenchymal hematoma with layering blood products
within the left frontal lobe and a focus of hemorrhage within the
high right parietal lobe. Both of these regions demonstrate
extensive surrounding edema. Findings are concerning for the
possibility of hemorrhagic metastases given multifocality.
Hemorrhagic or venous infarcts are additional considerations but
considered less likely.
2. There is extension of blood products within the
left-greater-than-right lateral ventricles.
3. Recommend correlation/further evaluation with contrast-enhanced
MRI to further define these lesions and evaluate for causative
etiology.
Critical Value/emergent results were called by telephone at the time
of interpretation on 03/24/2014 at [DATE] to Dr. JERSI GONSAGA ,
who verbally acknowledged these results.

## 2015-05-29 IMAGING — CT CT CERVICAL SPINE WITHOUT CONTRAST
4 series · 16 of 33 positions shown, 19 images · non-contrast
Comparison: None.

CLINICAL DATA: Fall with neck pain

EXAM:
CT CERVICAL SPINE WITHOUT CONTRAST
TECHNIQUE: Multidetector CT imaging of the cervical spine was performed without
intravenous contrast. Multiplanar CT image reconstructions were also
generated.

[Series 3: c spine soft · axial · 0.41mm/px · z∈[-191,-153]mm · 2 of 95 slices shown]
[im 19/95  soft-tissue]
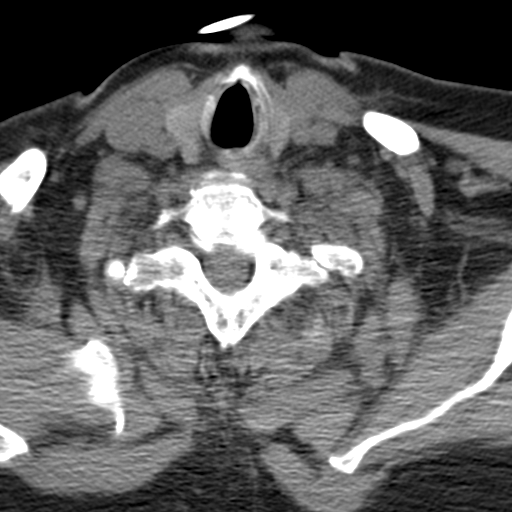
[im 38/95  soft-tissue]
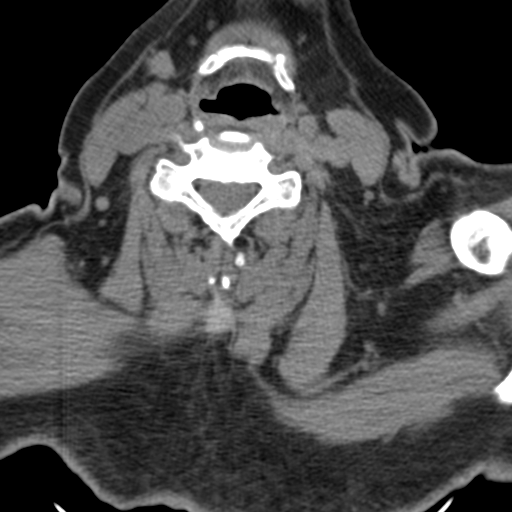

[Series 4: sag bone · sagittal · 0.38mm/px · 5 of 98 slices shown, 6 images]
[im 33/98  bone]
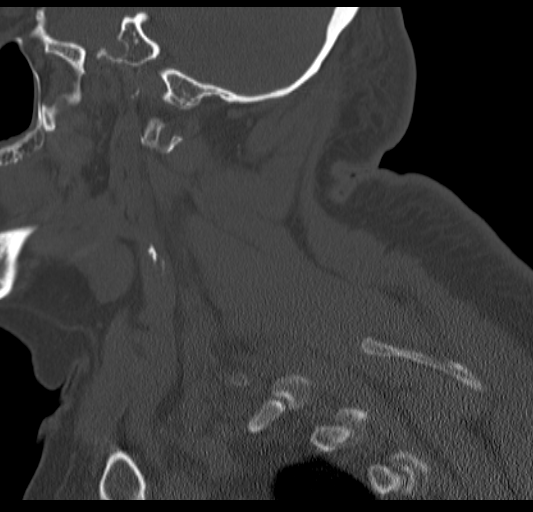
[im 41/98  bone]
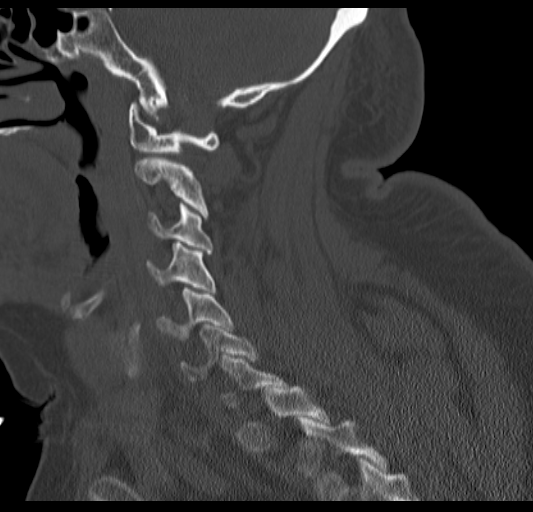
[im 49/98  soft-tissue]
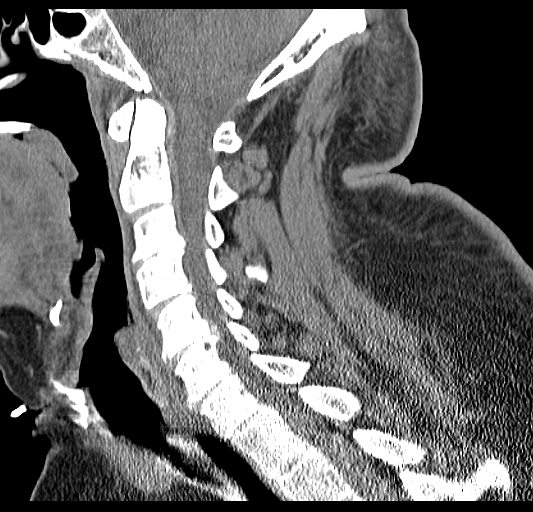
[im 49/98  bone]
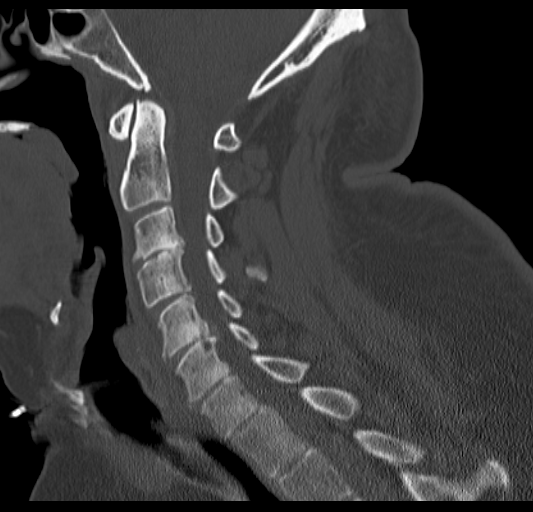
[im 57/98  bone]
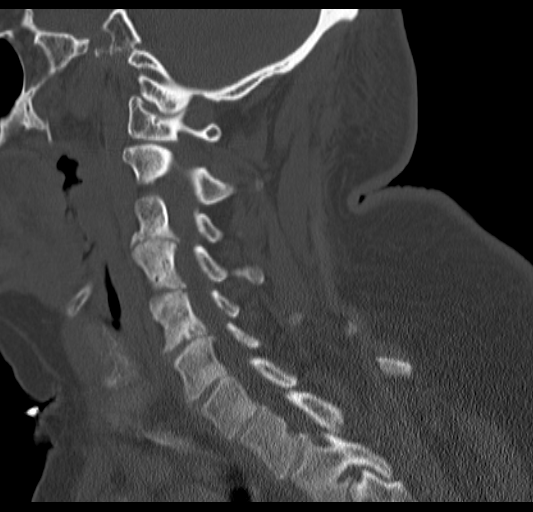
[im 65/98  bone]
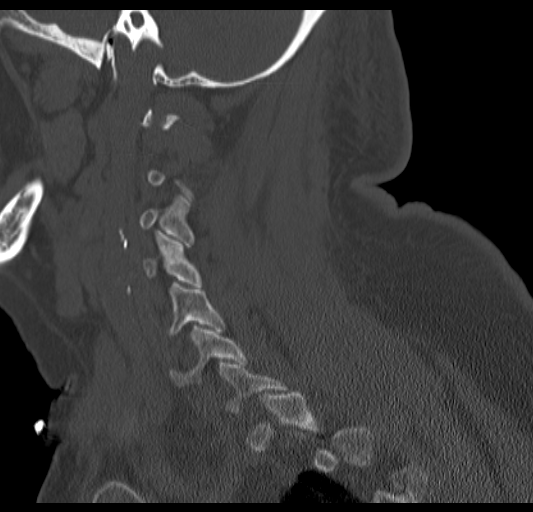

[Series 5: cor bone · coronal · 0.40mm/px · 3 of 75 slices shown]
[im 15/75  bone]
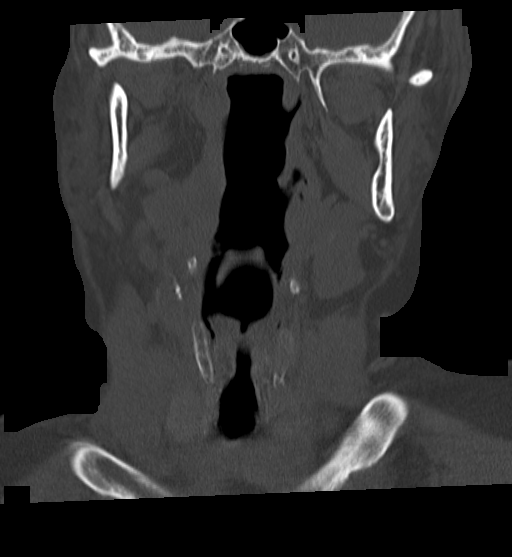
[im 30/75  bone]
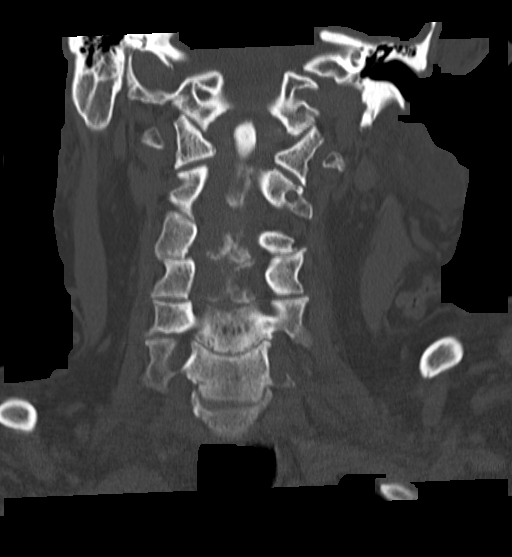
[im 45/75  bone]
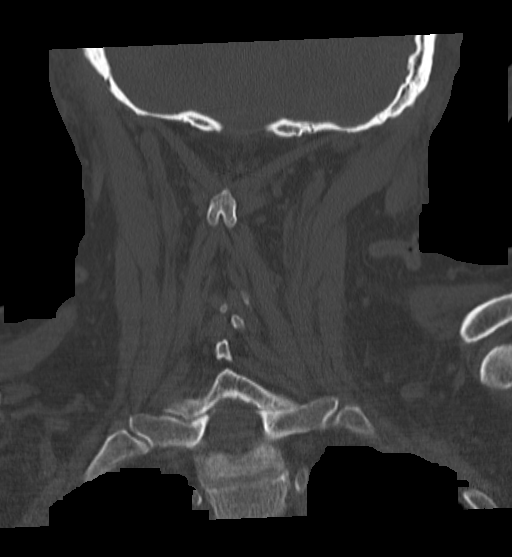

[Series 6: orthogonal axials · axial · 0.34mm/px · z∈[-229,-71]mm · 6 of 115 slices shown, 8 images]
[im 17/115  soft-tissue]
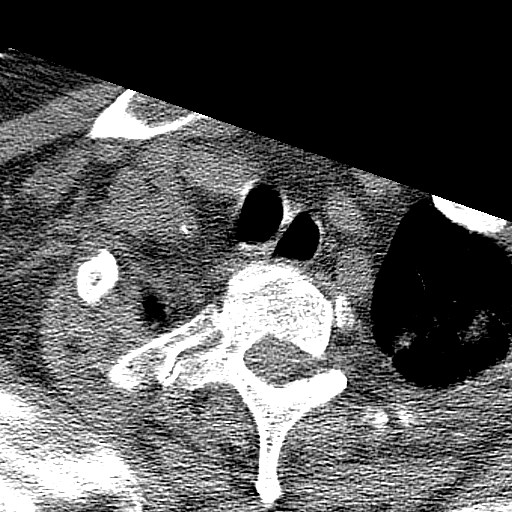
[im 17/115  bone]
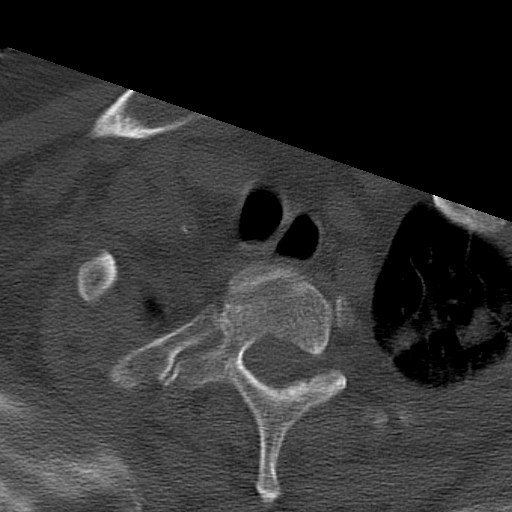
[im 33/115  bone]
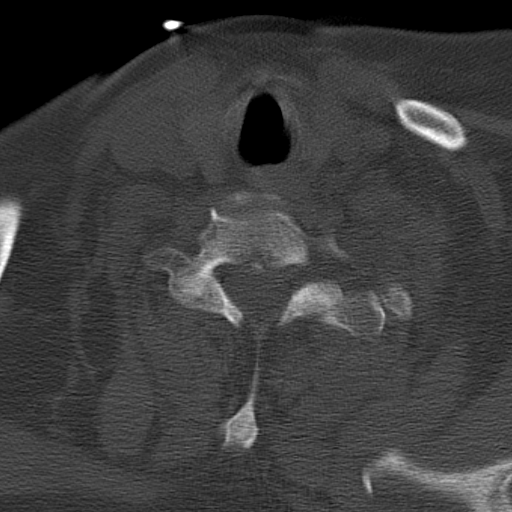
[im 49/115  bone]
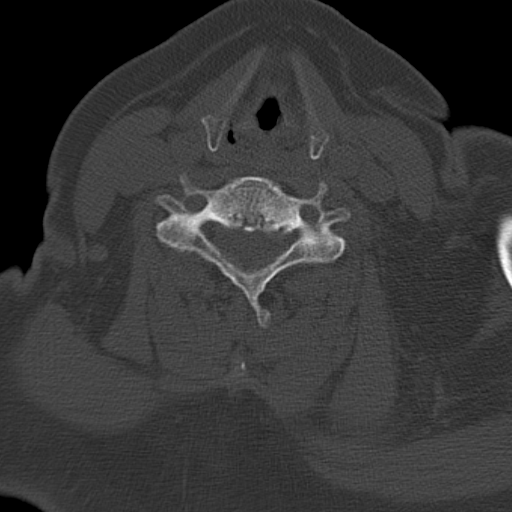
[im 66/115  bone]
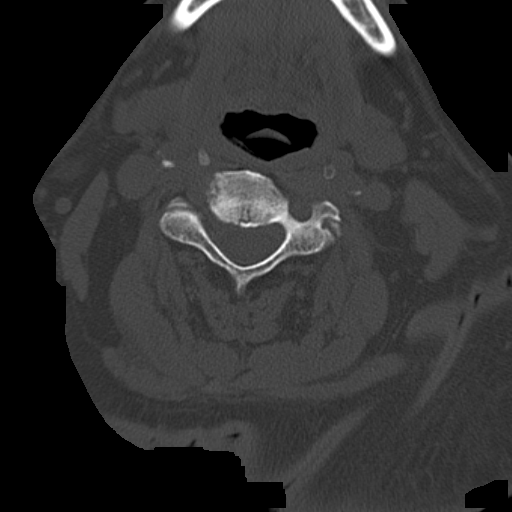
[im 82/115  soft-tissue]
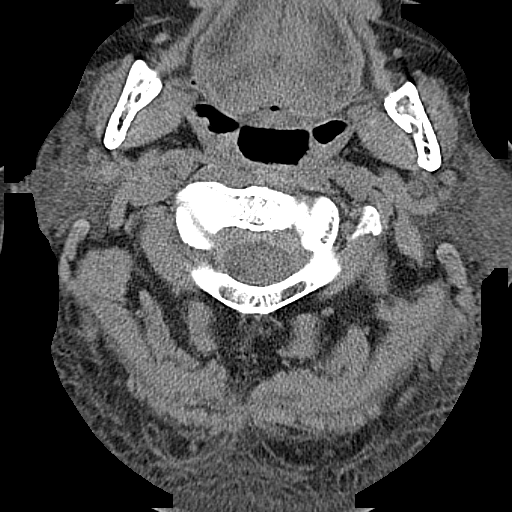
[im 82/115  bone]
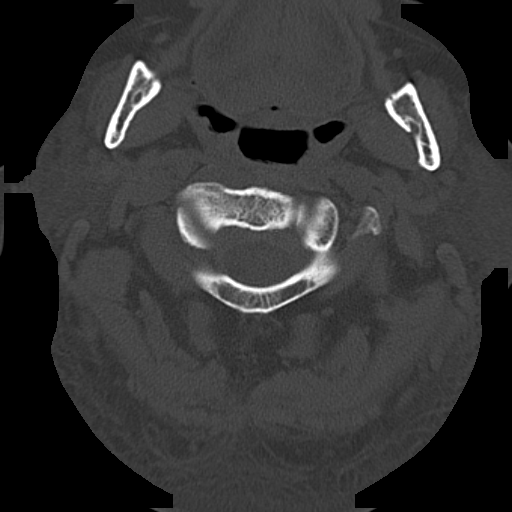
[im 98/115  bone]
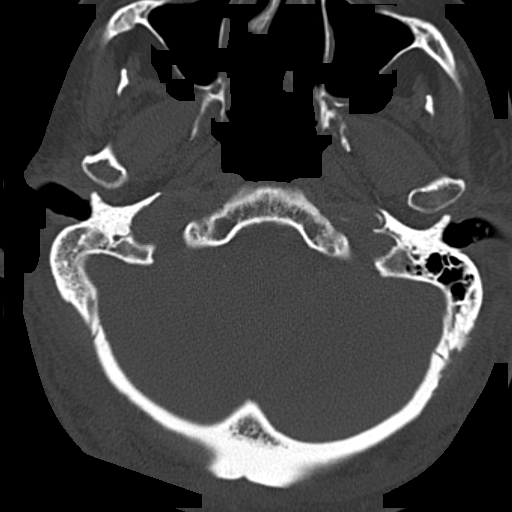

[16 of 33 positions shown; findings below may reference images not displayed]

FINDINGS: No acute fracture. No dislocation. Anatomic alignment. No obvious
spinal hematoma. Thyroid gland is unremarkable.

Limited images of the lung apices demonstrates a bilateral upper
lobe airspace disease.

Degenerative changes are seen throughout the cervical spine.
Multilevel spinal stenosis is multifactorial.
IMPRESSION: No acute bony injury.

Bilateral apical pulmonary airspace disease.

Multilevel spinal stenosis in the cervical spine related to
degenerative disc disease.
